# Patient Record
Sex: Male | Born: 1975 | Race: Asian | Hispanic: No | Marital: Married | Smoking: Never smoker
Health system: Southern US, Community
[De-identification: ages and names within clinical notes are randomized; demographics above are authoritative.]

## PROBLEM LIST (undated history)

## (undated) DIAGNOSIS — J45909 Unspecified asthma, uncomplicated: Secondary | ICD-10-CM

## (undated) HISTORY — DX: Unspecified asthma, uncomplicated: J45.909

---

## 2001-10-19 ENCOUNTER — Encounter: Payer: Self-pay | Admitting: Emergency Medicine

## 2001-10-19 ENCOUNTER — Emergency Department (HOSPITAL_COMMUNITY): Admission: EM | Admit: 2001-10-19 | Discharge: 2001-10-19 | Payer: Self-pay | Admitting: Emergency Medicine

## 2011-07-04 ENCOUNTER — Ambulatory Visit: Payer: Self-pay | Admitting: Physician Assistant

## 2011-07-04 ENCOUNTER — Ambulatory Visit: Payer: Self-pay

## 2011-07-04 VITALS — BP 157/89 | HR 121 | Temp 99.3°F | Resp 32 | Ht 65.0 in | Wt 165.2 lb

## 2011-07-04 DIAGNOSIS — R0902 Hypoxemia: Secondary | ICD-10-CM

## 2011-07-04 DIAGNOSIS — R05 Cough: Secondary | ICD-10-CM

## 2011-07-04 DIAGNOSIS — R509 Fever, unspecified: Secondary | ICD-10-CM

## 2011-07-04 DIAGNOSIS — R059 Cough, unspecified: Secondary | ICD-10-CM

## 2011-07-04 DIAGNOSIS — D7289 Other specified disorders of white blood cells: Secondary | ICD-10-CM

## 2011-07-04 DIAGNOSIS — D72829 Elevated white blood cell count, unspecified: Secondary | ICD-10-CM

## 2011-07-04 DIAGNOSIS — R0602 Shortness of breath: Secondary | ICD-10-CM

## 2011-07-04 LAB — POCT CBC
Granulocyte percent: 79.9 %G (ref 37–80)
HCT, POC: 44.1 % (ref 43.5–53.7)
Hemoglobin: 14.6 g/dL (ref 14.1–18.1)
Lymph, poc: 2.4 (ref 0.6–3.4)
MCH, POC: 26 pg — AB (ref 27–31.2)
MCHC: 33.1 g/dL (ref 31.8–35.4)
MCV: 78.6 fL — AB (ref 80–97)
MID (cbc): 0.8 (ref 0–0.9)
MPV: 14.7 fL (ref 0–99.8)
POC LYMPH PERCENT: 14.9 %L (ref 10–50)
RBC: 5.61 M/uL (ref 4.69–6.13)
RDW, POC: 14.3 %
WBC: 15.8 10*3/uL — AB (ref 4.6–10.2)

## 2011-07-04 LAB — POCT SEDIMENTATION RATE: POCT SED RATE: 18 mm/hr (ref 0–22)

## 2011-07-04 MED ORDER — ACETAMINOPHEN 325 MG PO TABS: 1000.0000 mg | ORAL_TABLET | Freq: Once | ORAL | Status: AC

## 2011-07-04 MED ORDER — ALBUTEROL SULFATE (2.5 MG/3ML) 0.083% IN NEBU
2.5000 mg | INHALATION_SOLUTION | Freq: Once | RESPIRATORY_TRACT | Status: AC
  Administered 2011-07-04: 2.5 mg via RESPIRATORY_TRACT

## 2011-07-04 MED ORDER — LEVOFLOXACIN 500 MG PO TABS: 500.0000 mg | ORAL_TABLET | Freq: Every day | ORAL | Status: AC

## 2011-07-04 MED ORDER — CEFTRIAXONE SODIUM 1 G IJ SOLR
1.0000 g | Freq: Once | INTRAMUSCULAR | Status: AC
  Administered 2011-07-04: 1 g via INTRAMUSCULAR

## 2011-07-04 NOTE — Progress Notes (Signed)
Subjective:    Patient ID: Matthew Brooks, male    DOB: 02-20-1976, 36 y.o.   MRN: [id number]  HPI Mr. Favila comes in today and brought back urgently c/o SOB since last night around 10 pm. There is a slight language barrier. He states that he has had a cough for a few days but denies fever or chills, myalgias, or congestion.  He has a history of a prolonged cough for the last 1-2 years with unknown etiology.  PPD 2011 negative.  He is not c/o chest pain at any time but states he feels bloated in his stomach.  No n/v.  No abdominal pain.  He does work around [employer] but states that he was not around any new chemicals yesterday.  He does not wear respirator or mask. Non smoker Healthy otherwise    Review of Systems  Constitutional: Negative for fever and chills.  HENT: Negative for congestion and sore throat.   Respiratory: Positive for cough, chest tightness, shortness of breath and wheezing.   Cardiovascular: Negative for chest pain and palpitations.  Gastrointestinal: Positive for abdominal distention. Negative for nausea, vomiting and abdominal pain.  Musculoskeletal: Negative for myalgias.  Neurological: Negative for dizziness and headaches.       Objective:   Physical Exam  Vitals reviewed. Constitutional: He is oriented to person, place, and time. He appears well-developed and well-nourished. He appears distressed (dyspnea).  HENT:  Mouth/Throat: Oropharynx is clear and moist.  Eyes: No scleral icterus.  Cardiovascular: Normal rate, regular rhythm and normal heart sounds.   Pulmonary/Chest: Accessory muscle usage present. Tachypnea noted. He has wheezes (throughout every field on initial exam).       Post nebulizer treatment, accessory muscle usage decreased.  Faint wheezes heard but no rales or rhonchi.  Abdominal: Soft. Normal appearance and bowel sounds are normal. There is no tenderness.  Lymphadenopathy:    He has no cervical adenopathy.  Neurological: He is alert and  oriented to person, place, and time.  Skin: Skin is warm. He is not diaphoretic.    Results for orders placed in visit on 07/04/11  POCT CBC      Component Value Range   WBC 15.8 (*) 4.6 - 10.2 (K/uL)   Lymph, poc 2.4  0.6 - 3.4    POC LYMPH PERCENT 14.9  10 - 50 (%L)   MID (cbc) 0.8  0 - 0.9    POC MID % 5.2  0 - 12 (%M)   POC Granulocyte 12.6 (*) 2 - 6.9    Granulocyte percent 79.9  37 - 80 (%G)   RBC 5.61  4.69 - 6.13 (M/uL)   Hemoglobin 14.6  14.1 - 18.1 (g/dL)   HCT, POC 14.7  82.9 - 53.7 (%)   MCV 78.6 (*) 80 - 97 (fL)   MCH, POC 26.0 (*) 27 - 31.2 (pg)   MCHC 33.1  31.8 - 35.4 (g/dL)   RDW, POC 56.2     Platelet Count, POC 197  142 - 424 (K/uL)   MPV 14.7  0 - 99.8 (fL)   Pulse OX at Presentation 92% 2 Albuterol nebulizer treatments given and put on 2 liters Oxygen.  Pulse ox 98%. Distant wheezing noted on exam. No rales or rhonchi. Taken off oxygen for 5 minutes.  Pulse ox 92% Rest with no oxygen 94% 15 minutes later, Ambulated with no oxygen, pulse ox 97% Rested 10 minutes, pulse ox at rest 92-96% Ambulated 10 minutes later, pulse ox    [facility]  reading (PRIMARY) by  Dr. Neva Seat.  No infilitrate. Increased markings right.  Granuloma LLL (previously noted 2011)  EKG    Assessment & Plan:  SOB Wheezing Fever Cough Leukocytosis Hypoxia  At discharge, patient states he feels much better and pulse ox is 94-96%. Arranged for pt to obtain nebulizer machine from [facility] today.  Albuterol Nebule sx given. Levaquin 500 x 10 days. Home to rest.  Fever control and hydration. Advised to go to ER if increased SOB, chest pain, or new symptoms arise.   Recheck with me tomorrow or sooner if worse.  Patient and wife both express understanding of instructions.  Discussed with Dr. Neva Seat

## 2011-07-04 NOTE — Patient Instructions (Signed)
Use the breathing treatment machine every 4 hours. Albuterol Nebules have been provided for you to use in your breathing machine. Take the antibiotic once a day with food. Take tylenol or advil for fever  If you have worsening shortness of breath or experience chest pain, go to [facility] ER or [facility]. Return tomorrow for recheck. I come in at 5.  If you are experiencing any trouble in the morning, come in earlier.   Go to [facility] for Breathing machine [facility]  [address]

## 2011-11-08 ENCOUNTER — Ambulatory Visit: Payer: Self-pay | Admitting: Physician Assistant

## 2011-11-08 VITALS — BP 124/84 | HR 87 | Temp 98.0°F | Resp 20 | Ht 65.0 in | Wt 165.0 lb

## 2011-11-08 DIAGNOSIS — R05 Cough: Secondary | ICD-10-CM

## 2011-11-08 DIAGNOSIS — R059 Cough, unspecified: Secondary | ICD-10-CM

## 2011-11-08 DIAGNOSIS — J45909 Unspecified asthma, uncomplicated: Secondary | ICD-10-CM

## 2011-11-08 MED ORDER — ALBUTEROL SULFATE (2.5 MG/3ML) 0.083% IN NEBU: 2.5000 mg | INHALATION_SOLUTION | Freq: Four times a day (QID) | RESPIRATORY_TRACT | Status: DC | PRN

## 2011-11-08 MED ORDER — ALBUTEROL SULFATE (2.5 MG/3ML) 0.083% IN NEBU
2.5000 mg | INHALATION_SOLUTION | Freq: Once | RESPIRATORY_TRACT | Status: AC
  Administered 2011-11-08: 2.5 mg via RESPIRATORY_TRACT

## 2011-11-08 MED ORDER — ALBUTEROL SULFATE HFA 108 (90 BASE) MCG/ACT IN AERS: 2.0000 | INHALATION_SPRAY | Freq: Four times a day (QID) | RESPIRATORY_TRACT | Status: DC | PRN

## 2011-11-08 NOTE — Progress Notes (Signed)
Patient ID: Matthew Brooks MRN: [id number], DOB: Jun 14, 1975, 36 y.o. Date of Encounter: 11/08/2011, 10:42 AM  Primary Physician: No primary provider on file.  Chief Complaint: Asthma follow up  HPI: 36 y.o. year old male with history below presents for follow up of his asthma. Seen here urgently back in May. Was advised to follow up in 24 hours, he did not because he was feeling so much better with his treatment. Today he returns to our clinic for refills of his asthma medication and for evaluation of a mild nonproductive cough. Has had this mild cough for 2 days. Cough is nonproductive. Not associated with time of day. Afebrile. No chills. No shortness of breath or wheezing. No dyspnea. No congestion, or myalgias. No chest tightness. Has not had a single flare up of his asthma since his evaluation here back in May. He is quite pleased with the control of his asthma currently. 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 He does not wear a mask or respirator.    Past Medical History  Diagnosis Date  . Asthma      Home Meds: Prior to Admission medications   Medication Sig Start Date End Date Taking? Authorizing Provider  albuterol (PROVENTIL HFA) 108 (90 BASE) MCG/ACT inhaler Inhale 2 puffs into the lungs every 6 (six) hours as needed.   Yes Historical Provider, MD    Allergies: No Known Allergies  History   Social History  . Marital Status: Married    Spouse Name: N/A    Number of Children: N/A  . Years of Education: N/A   Occupational History  . Not on file.   Social History Main Topics  . Smoking status: Never Smoker   . Smokeless tobacco: Not on file  . Alcohol Use: Not on file  . Drug Use: Not on file  . Sexually Active: Not on file   Other Topics Concern  . Not on file   Social History Narrative  . No narrative on file     Review of Systems: Constitutional: negative for chills, fever, night sweats, weight changes, or fatigue  HEENT: negative for vision  changes, hearing loss, congestion, rhinorrhea, ST, epistaxis, or sinus pressure Cardiovascular: negative for chest pain or palpitations Respiratory: negative for hemoptysis, wheezing, or shortness of breath Abdominal: negative for abdominal pain, nausea, vomiting, diarrhea, or constipation Dermatological: negative for rash Neurologic: negative for headache, dizziness, or syncope All other systems reviewed and are otherwise negative with the exception to those above and in the HPI.   Physical Exam: Blood pressure 124/84, pulse 87, temperature 98 F (36.7 C), temperature source Oral, resp. rate 20, height 5\' 5"  (1.651 m), weight 165 lb (74.844 kg), SpO2 100.00%., Body mass index is 27.46 kg/(m^2). General: Well developed, well nourished, in no acute distress. Head: Normocephalic, atraumatic, eyes without discharge, sclera non-icteric, nares are without discharge. Bilateral auditory canals clear, TM's are without perforation, pearly grey and translucent with reflective cone of light bilaterally. Oral cavity moist, posterior pharynx without exudate, erythema, peritonsillar abscess, or post nasal drip. Poor dentition.  Neck: Supple. No thyromegaly. Full ROM. No lymphadenopathy. Lungs: Mild expiratory wheezing bilaterally without  rales, or rhonchi. Breathing is unlabored. Heart: RRR with S1 S2. No murmurs, rubs, or gallops appreciated. Msk:  Strength and tone normal for age. Extremities/Skin: Warm and dry. No clubbing or cyanosis. No edema. No rashes or suspicious lesions. Neuro: Alert and oriented X 3. Moves all extremities spontaneously. Gait is normal. CNII-XII grossly in tact. Psych:  Responds to questions appropriately with a normal affect.   S/P albuterol neb: Patient feels that he can breath much better. No further cough. No further wheezing to auscultation.    ASSESSMENT AND PLAN:  36 y.o. 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

## 2012-11-05 ENCOUNTER — Ambulatory Visit: Payer: 59 | Admitting: Emergency Medicine

## 2012-11-05 VITALS — BP 112/72 | HR 76 | Temp 97.2°F | Resp 18 | Ht 65.5 in | Wt 165.2 lb

## 2012-11-05 DIAGNOSIS — R059 Cough, unspecified: Secondary | ICD-10-CM

## 2012-11-05 DIAGNOSIS — J45909 Unspecified asthma, uncomplicated: Secondary | ICD-10-CM

## 2012-11-05 DIAGNOSIS — R05 Cough: Secondary | ICD-10-CM

## 2012-11-05 MED ORDER — ALBUTEROL SULFATE HFA 108 (90 BASE) MCG/ACT IN AERS: 2.0000 | INHALATION_SPRAY | Freq: Four times a day (QID) | RESPIRATORY_TRACT | Status: DC | PRN

## 2012-11-05 NOTE — Patient Instructions (Addendum)
Hen Suy?n, Ng??i L?n  (Asthma, Adult)  Hen suy?n l tnh tr?ng ?nh h??ng t?i ph?i. N ???c ??c tr?ng b?i s?ng v h?p ???ng h h?p c?ng nh? t?ng ti?t d?ch nh?y. H?p l do s?ng v co th?t c? bn trong ???ng h h?p. Khi ?i?u ny x?y ra, th? c th? kh kh?n v b?n c th? c ho, th? kh kh v th? d?c. Cng hi?u nhi?u v? hen suy?n th cng c th? gip b?n ch? ng? n t?t h?n. Khng th? ch?a kh?i hen suy?n, nh?ng thu?c v thay ??i cch s?ng c th? gip ki?m sot n. Hen suy?n c th? l v?n ?? nh? ??i v?i m?t s? ng??i nh?ng n?u hen suy?n khng ???c ki?m sot , n c th? d?n t?i c?n hen ?e d?a ??n tnh m?ng. Hen suy?n c th? thay ??i theo th?i gian. ?i?u quan tr?ng l c?n ph?i h?p v?i chuyn gia ch?m [place] y t? c?a b?n ?? ch? ng? tri?u ch?ng hen suy?n. NGUYN NHN  Nguyn nhn chnh xc gy hen suy?n khng ???c xc ??nh. Hen suy?n ???c cho l do di truy?n (di truy?n) v ti?p xc v?i mi tr??ng. S?ng v t?y ?? (vim) ???ng h h?p x?y ra trong hen suy?n. ?i?u ny c th? b? kch thch b?i d? ?ng, nhi?m trng ph?i do vi rt, ho?c cc ch?t kch thch trong khng kh. Ph?n ?ng d? ?ng c th? khi?n b?n th? kh kh ngay l?p t?c ho?c vi gi? sau khi ti?p xc. Tc nhn gy hen suy?n khc nhau ??i v?i t?ng ng??i. ?i?u quan tr?ng l ph?i ch  v bi?t nguyn nhn gy ra hen suy?n.  Tc nhn ph? bi?n gy ra c?n hen bao g?m:   B?i b?n t? da, tc ??ng v?t ho?c lng ??ng v?t.  M?t b?i c trong b?i trong nh.  Gin.  Ph?n hoa t? cy ho?c c?.  N?m m?c.  Thu?c l ho?c khi thu?c l. Khng th? cho php ht thu?c trong nh c ng??i b? hen suy?n. Ng??i b? hen suy?n khng nn ht thu?c v khng nn ? g?n ng??i ht thu?c.  Cc ch?t  nhi?m khng kh nh? b?i, ch?t t?y r?a gia d?ng, ch?t x?t tc, ch?t phun x?t, h?i s?n, ha ch?t m?nh ho?c mi m?nh.  Khng kh l?nh ho?c thay ??i th?i ti?t. Khng kh l?nh c th? gy vim. Gi lm t?ng n?m m?c, ph?n hoa trong khng kh. Khng c m?t kh h?u t?t nh?t cho ng??i b? hen suy?n.  Nh?ng c?m  xc m?nh nh? khc ho?c c??i nhi?u.  S? c?ng th?ng.  M?t s? lo?i thu?c c? th? nh? atpirin ho?c thu?c ch?n beta.  [employer] trong cc lo?i th?c ph?m v ?? u?ng nh? tri cy kh v r??u vang.  Cc tnh tr?ng nhi?m trng ho?c vim nh? cm, c?m l?nh ho?c vim mng m?i (vim m?i ).  B?nh tro ng??c d? dy th?c qu?n (GERD). GERD l tnh tr?ng axit trong d? dy tro ng??c ln c? h?ng (th?c qu?n).  T?p luy?n ho?c ho?t ??ng v?t v?. Thu?c tr??c khi t?p th? d?c thch h?p cho php h?u h?t m?i ng??i tham gia cc mn th? thao. TRI?U CH?NG   C?m th?y th? d?c.  T?c ho?c ?au ng?c.  Kh ng? do ho, th? kh kh ho?c c?m th?y th? d?c.  m thanh hut so hay th? kh kh v?i vi?c th? ra.  Ho ho?c th? kh kh t? h?n khi b?n:  B? nhi?m vi  rt (nh? c?m l?nh ho?c cm).  ?ang b? d? ?ng.  Ti?p xc v?i m?t s? lo?i h?i ho?c ha ch?t nh?t ??nh.  T?p luy?n. D?u hi?u cho th?y hen suy?n c th? tr? nn t? h?n bao g?m:   D?u hi?u v tri?u ch?ng c?a hen suy?n th??ng xuyn h?n v kh ch?u.  T?ng hi?n t??ng kh th?. Hi?n t??ng ny c th? ???c ?o b?ng my ?o l?u l??ng ??nh, ?y l m?t thi?t b? ??n gi?n ???c s? d?ng ?? ki?m tra xem ph?i c?a b?n ?ang ho?t ??ng nh? th? no.  Ngy cng c?n s? d?ng ?ng ht gi?m ?au nhanh th??ng xuyn h?n. CH?N ?ON  Vi?c ch?n ?on hen suy?n ???c th?c hi?n b?ng cch xem xt b?nh s? c?a b?n, khm tr?c ti?p v c th? t? cc xt nghi?m khc. Nghin c?u ch?c n?ng ph?i c th? gip ch?n ?on.  ?I?U TR?  Khng th? ch?a kh?i hen suy?n. Tuy nhin, ??i v?i ?a ph?n ng??i l?n, hen suy?n c th? ???c ki?m sot b?ng ?i?u tr?. Bn c?nh vi?c trnh cc tc nhn gy hen suy?n, th??ng c?n dng c? thu?c. C 2 lo?i thu?c ???c s? d?ng ?? ?i?u tr? hen suy?n: thu?c ki?m sot (gi?m vim v tri?u ch?ng) v thu?c lm gi?m ho?c thu?c gi?i c?u (gi?m tri?u ch?ng hen suy?n trong c?n hen c?p tnh). B?n c th? c?n dng thu?c hng ngy ?? ki?m sot hen suy?n. Cc lo?i thu?c ki?m sot lu di hi?u qu? nh?t cho hen suy?n l  corticosteroid ?? ht (ng?n ch?n vim). Cc lo?i thu?c ki?m sot lu di khc bao g?m:   Thu?c khng leukotriene (ch?n ???ng c?a tnh tr?ng vim).  Cc lo?i thu?c tc d?ng di h?n (th? gin c? b?p c?a ???ng h h?p t nh?t 12 gi?) v?i corticosteroid dng ?? ht.  Cromolyn natri ho?c nedocromil (lm thay ??i kh? n?ng gi?i phng cc ha ch?t gy vim c?a m?t s? t? bo b? vim).  Immunomodulator (lm thay ??i h? th?ng mi?n d?ch ?? ng?n ch?n cc tri?u ch?ng hen suy?n).  Theophylline (th? gin c? trong ???ng h h?p). B?n c?ng c th? yu c?u m?t lo?i thu?c tc d?ng ng?n h?n ?? lm gi?m cc tri?u ch?ng hen suy?n trong c?n hen c?p tnh.  B?n nn hi?u ph?i lm g trong c?n hen c?p tnh. Thu?c ht c hi?u qu? khi ???c s? d?ng ?ng cch. ??c h??ng d?n v? cch s? d?ng thu?c c?a b?n m?t cch chnh xc v ni chuy?n v?i chuyn gia ch?m [place] y t? n?u b?n c cu h?i. ??n g?p chuyn gia ch?m [place] y t? ?? khm l?i theo ??nh k? ?? ??m b?o b?nh hen suy?n c?a b?n ???c ki?m sot t?t. N?u hen suy?n khng ???c ki?m sot t?t, n?u b?n b? nh?p vi?n v hen suy?n, ho?c n?u c?n nhi?u lo?i thu?c ho?c corticosteroid dng ?? ht li?u v?a ??n li?u cao ?? ki?m sot b?nh hen suy?n c?a b?n, hy yu c?u gi?i thi?u ??n m?t chuyn gia v? b?nh hen suy?n.  H??NG D?N CH?M [place] T?I NH   S? d?ng thu?c theo ch? d?n c?a chuyn gia ch?m [place] y t?.  Ki?m sot mi tr??ng trong nh c?a b?n theo nh?ng cch sau ?? gip ng?n ch?n c?n hen:  Thay b? l?c l s??i v ?i?u ha khng kh t nh?t m?t l?n m?i thng.  ??t b? l?c ho?c mi?ng v?i th?a trn cc l? thng h?i c?a l s??i v ?i?u ha khng kh.  H?n ch? s? d?ng l s??i v b?p c?i.  Khng ht thu?c l. Khng ? nh?ng n?i c ng??i khc ?ang ht thu?c.  Di?t v?t gy h?i (ch?ng h?n nh? gin v chu?t) v phn c?a chng.  N?u b?n nhn th?y n?m m?c trn cy, hy v?t n ?i.  Lau sn nh v ht b?i m?i tu?n. S? d?ng s?n ph?m lm s?ch khng mi. S? d?ng my ht b?i s?ch h?n v?i b? l?c HEPA, n?u c th?.  N?u vi?c ht b?i ho?c d?n d?p gy ra hen suy?n cho b?n, hy c? g?ng tm ng??i no khc ?? lm cc vi?c v?t ny.  Sn nh nn lm b?ng g?, ?t lt ho?c nh?a vinyl. Th?m c th? dnh lng v b?i.  S? d?ng g?i, ga tr?i gi??ng v ga tr?i n?m l xo ch?ng d? ?ng.  Gi?t kh?n tr?i gi??ng v ch?n hng tu?n trong n??c nng v lm kh trong my s?y.  S? d?ng ch?n ???c lm b?ng s?i t?ng h?p ho?c bng v?i d?t tuy?t ch?t.  Khng s? d?ng di?m ng?n cho gi??ng.  V? sinh phng t?m v nh b?p b?ng thu?c t?y v s?n l?i b?ng s?n ch?ng n?m m?c.  R?a tay th??ng xuyn.  Ni chuy?n v?i chuyn gia ch?m [place] y t? c?a b?n v? m?t k? ho?ch hnh ??ng ?? qu?n l c?n hen. ?i?u ny bao g?m vi?c s? d?ng m?t my ?o l?u l??ng ??nh ?? ?o m?c ?? nghim tr?ng c?a c?n hen v cc lo?i thu?c c th? gip ng?n ch?n c?n hen. M?t k? ho?ch hnh ??ng c th? gip gi?m thi?u ho?c ng?n ch?n cc c?n hen m khng c?n ph?i nh? ??n s? ch?m [place] y t?.  Gi? bnh t?nh trong c?n hen.  Lun c m?t k? ho?ch chu?n b? tham v?n v?i chuyn gia y t?. ?i?u ny bao g?m c? vi?c lin h? v?i chuyn gia ch?m [place] y t? c?a b?n v trong tr??ng h?p b? c?n hen n?ng, hy g?i d?ch v? c?p c?u t?i ??a ph??ng (911 t?i [place] HY THAM V?N V?I CHUYN GIA Y T? N?U:   B?n th? kh kh, th? d?c ho?c ho ngay c? khi ? dng thu?c ?? ng?n ch?n c?n hen.  B?n c ?m dy.  ?m c?a b?n thay ??i t? khng mu (trong) ho?c mu tr?ng sang mu vng, xanh, xm ho?c c mu.  B?n c b?t k? v?n ?? no lin quan ??n thu?c ?ang s? d?ng (ch?ng h?n nh? pht ban, ng?a, s?ng ho?c kh th?).  B?n s? d?ng thu?c lm gi?m hen suy?n nhi?u h?n 2-3 l?n m?i tu?n.  L?u l??ng ??nh c?a b?n v?n ? kho?ng 50-79% ch? s? c nhn t?t nh?t sau khi lm theo k? ho?ch hnh ??ng c?a b?n trong 1 gi?. HY NGAY L?P T?C THAM V?N V?I CHUYN GIA Y T? N?U:   B?n th? d?c ngay c? khi ngh?Marland Kitchen  B?n th? d?c khi th?c hi?n ho?t ??ng th? ch?t r?t t.  B?n g?p kh kh?n khi ?n, u?ng ho?c ni chuy?n do cc tri?u ch?ng hen  suy?n.  B?n b? ?au ng?c ho?c c?m th?y tim ??p nhanh.  Mi ho?c mng tay c?a b?n c mu xanh nh?t.  B?n b? ?au ??u, chng m?t ho?c ng?t x?u.  B?n b? s?t ho?c c cc tri?u ch?ng ko di h?n 2-3 ngy.  B?n b? s?t v cc tri?u ch?ng ??t nhin x?u ?i.  B?n d??ng nh? tr? nn t? h?n v khng ?p ?  ng v?i vi?c ?i?u tr? trong c?n hen.  L?u l??ng ??nh c?a b?n d??i 50% ch? s? c nhn t?t nh?t. ??M B?O B?N:   Hi?u cc h??ng d?n ny.  S? theo di tnh tr?ng c?a mnh.  S? yu c?u tr? gip ngay l?p t?c n?u b?n c?m th?y khng kh?e ho?c tnh tr?ng tr? nn t?i h?n. Document Released: 02/19/2005 Document Revised: 02/06/2012 [facility] Patient Information 2014 [place], [place].

## 2012-11-05 NOTE — Progress Notes (Signed)
[facility] [address] [place] [address] [phone]  Date:  11/05/2012   Name:  Matthew Brooks   DOB:  1976-02-21   MRN:  [id number]  PCP:  No primary provider on file.    Chief Complaint: Medication Refill and Cough   History of Present Illness:  Matthew Brooks is a 37 y.o. very pleasant male patient who presents with the following:  History of asthma.  Ran out of his MDI 2 weeks ago and has been doing reasonably well.  Says he usually uses his MDI 2-3 times a week.  At one time he was not under control and used his MDI 2-3 times a day.  No improvement with over the counter medications or other home remedies. Denies other complaint or health concern today.    There are no active problems to display for this patient.   Past Medical History  Diagnosis Date  . Asthma     History reviewed. No pertinent past surgical history.  History  Substance Use Topics  . Smoking status: Never Smoker   . Smokeless tobacco: Not on file  . Alcohol Use: No    History reviewed. No pertinent family history.  No Known Allergies  Medication list has been reviewed and updated.  Current Outpatient Prescriptions on File Prior to Visit  Medication Sig Dispense Refill  . albuterol (PROVENTIL HFA) 108 (90 BASE) MCG/ACT inhaler Inhale 2 puffs into the lungs every 6 (six) hours as needed.  1 Inhaler  2  . albuterol (PROVENTIL) (2.5 MG/3ML) 0.083% nebulizer solution Take 3 mLs (2.5 mg total) by nebulization every 6 (six) hours as needed for wheezing.  75 mL  11   Current Facility-Administered Medications on File Prior to Visit  Medication Dose Route Frequency Provider Last Rate Last Dose  . acetaminophen (TYLENOL) tablet 975 mg  975 mg Oral Once Pattricia Boss, PA-C        Review of Systems:  As per HPI, otherwise negative.    Physical Examination: Filed Vitals:   11/05/12 1408  BP: 112/72  Pulse: 76  Temp: 97.2 F (36.2 C)  Resp: 18   Filed Vitals:   11/05/12  1408  Height: 5' 5.5" (1.664 m)  Weight: 165 lb 3.2 oz (74.934 kg)   Body mass index is 27.06 kg/(m^2). Ideal Body Weight: Weight in (lb) to have BMI = 25: 152.2  GEN: WDWN, NAD, Non-toxic, A & O x 3 HEENT: Atraumatic, Normocephalic. Neck supple. No masses, No LAD. Ears and Nose: No external deformity. CV: RRR, No M/G/R. No JVD. No thrill. No extra heart sounds. PULM: CTA B, no wheezes, crackles, rhonchi. No retractions. No resp. distress. No accessory muscle use. ABD: S, NT, ND, +BS. No rebound. No HSM. EXTR: No c/c/e NEURO Normal gait.  PSYCH: Normally interactive. Conversant. Not depressed or anxious appearing.  Calm demeanor.    Assessment and Plan: Asthma  Signed,  Phillips Odor, MD

## 2013-07-31 ENCOUNTER — Ambulatory Visit: Payer: 59

## 2013-07-31 ENCOUNTER — Telehealth: Payer: Self-pay | Admitting: *Deleted

## 2013-07-31 ENCOUNTER — Ambulatory Visit: Payer: 59 | Admitting: Family Medicine

## 2013-07-31 VITALS — BP 122/76 | HR 103 | Temp 97.9°F | Resp 16 | Ht 65.0 in | Wt 166.0 lb

## 2013-07-31 DIAGNOSIS — R112 Nausea with vomiting, unspecified: Secondary | ICD-10-CM

## 2013-07-31 DIAGNOSIS — J189 Pneumonia, unspecified organism: Secondary | ICD-10-CM

## 2013-07-31 DIAGNOSIS — R509 Fever, unspecified: Secondary | ICD-10-CM

## 2013-07-31 LAB — POCT CBC
Granulocyte percent: 80.3 %G — AB (ref 37–80)
HCT, POC: 43.6 % (ref 43.5–53.7)
Hemoglobin: 14.1 g/dL (ref 14.1–18.1)
Lymph, poc: 1.3 (ref 0.6–3.4)
MCH, POC: 26.3 pg — AB (ref 27–31.2)
MCHC: 32.3 g/dL (ref 31.8–35.4)
MCV: 81.2 fL (ref 80–97)
MID (cbc): 0.5 (ref 0–0.9)
MPV: 14.6 fL (ref 0–99.8)
POC Granulocyte: 7.6 — AB (ref 2–6.9)
POC LYMPH PERCENT: 14.2 %L (ref 10–50)
POC MID %: 5.5 %M (ref 0–12)
Platelet Count, POC: 174 10*3/uL (ref 142–424)
RBC: 5.37 M/uL (ref 4.69–6.13)
RDW, POC: 13.8 %
WBC: 9.5 10*3/uL (ref 4.6–10.2)

## 2013-07-31 MED ORDER — AMOXICILLIN-POT CLAVULANATE 875-125 MG PO TABS: 1.0000 | ORAL_TABLET | Freq: Two times a day (BID) | ORAL | Status: DC

## 2013-07-31 MED ORDER — HYDROCODONE-HOMATROPINE 5-1.5 MG/5ML PO SYRP: 5.0000 mL | ORAL_SOLUTION | Freq: Three times a day (TID) | ORAL | Status: DC | PRN

## 2013-07-31 NOTE — Telephone Encounter (Signed)
[facility] called report: Left lower lobe airspace opacity concerning for pneumonia. Pt is taking Augmentin and Hycodan. Please advise any further instructions for pt.

## 2013-07-31 NOTE — Progress Notes (Signed)
This chart was scribed for Caroleen Hamman, MD by Eston Mould, ED Scribe. This patient was seen in room Room/bed 10 and the patient's care was started at 4:40 PM. Subjective:    Patient ID: Matthew Brooks, male    DOB: Jun 25, 1975, 38 y.o.   MRN: [id number] Chief Complaint  Patient presents with   Fever   Emesis   HPI Matthew Brooks is a 38 y.o. male who presents to the [facility] complaining of subjective fever x 2 days and emesis with associated diarrhea that began today. He reports having a decreased appetite and states "food does not taste good". Reports having a slight cough; states he uses ProAir. Denies having abd pain  Work: Works in [employer].  There are no active problems to display for this patient.  Current outpatient prescriptions:albuterol (PROVENTIL HFA) 108 (90 BASE) MCG/ACT inhaler, Inhale 2 puffs into the lungs every 6 (six) hours as needed., Disp: 1 Inhaler, Rfl: 12;  albuterol (PROVENTIL) (2.5 MG/3ML) 0.083% nebulizer solution, Take 3 mLs (2.5 mg total) by nebulization every 6 (six) hours as needed for wheezing., Disp: 75 mL, Rfl: 11 Current facility-administered medications:acetaminophen (TYLENOL) tablet 975 mg, 975 mg, Oral, Once, Beatriz Chancellor, PA-C  Review of Systems  Constitutional: Positive for fever. Negative for appetite change.  Respiratory: Positive for cough.   Gastrointestinal: Positive for vomiting and diarrhea.   Objective:   Physical Exam  Nursing note and vitals reviewed. Constitutional: He is oriented to person, place, and time. He appears well-developed and well-nourished. No distress.  Pt speaks with a strong [place] accent and is hard to understand.  HENT:  Head: Normocephalic and atraumatic.  Right Ear: External ear normal.  Left Ear: External ear normal.  Eyes: Conjunctivae are normal. Pupils are equal, round, and reactive to light.  Neck: Normal range of motion. Neck supple.  Cardiovascular: Normal rate, regular rhythm and  normal heart sounds.   No murmur heard. Pulmonary/Chest: Effort normal. He has wheezes (primarily to L). He has rales (to L chest). He exhibits no tenderness.  Abdominal: Soft. Bowel sounds are normal. He exhibits no distension and no mass. There is no tenderness. There is no rebound and no guarding.  Musculoskeletal: Normal range of motion.  Neurological: He is alert and oriented to person, place, and time. He has normal reflexes.  Skin: Skin is warm and dry. He is not diaphoretic.  Psychiatric: He has a normal mood and affect. His behavior is normal.   Triage Vitals:BP 122/76   Pulse 103   Temp(Src) 97.9 F (36.6 C) (Oral)   Resp 16   Ht 5\' 5"  (1.651 m)   Wt 166 lb (75.297 kg)   BMI 27.62 kg/m2   SpO2 96%  [facility] preliminary x-ray report read by Dr. Floreen Comber: L lower lobe hazy density.   Results for orders placed in visit on 07/31/13  POCT CBC      Result Value Ref Range   WBC 9.5  4.6 - 10.2 K/uL   Lymph, poc 1.3  0.6 - 3.4   POC LYMPH PERCENT 14.2  10 - 50 %L   MID (cbc) 0.5  0 - 0.9   POC MID % 5.5  0 - 12 %M   POC Granulocyte 7.6 (*) 2 - 6.9   Granulocyte percent 80.3 (*) 37 - 80 %G   RBC 5.37  4.69 - 6.13 M/uL   Hemoglobin 14.1  14.1 - 18.1 g/dL   HCT, POC 43.6  43.5 - 53.7 %   MCV  81.2  80 - 97 fL   MCH, POC 26.3 (*) 27 - 31.2 pg   MCHC 32.3  31.8 - 35.4 g/dL   RDW, POC 13.8     Platelet Count, POC 174  142 - 424 K/uL   MPV 14.6  0 - 99.8 fL   Assessment & Plan:  Will complete CXR and CBC.  Fever - Plan: POCT CBC, DG Chest 2 View, amoxicillin-clavulanate (AUGMENTIN) 875-125 MG per tablet  Pneumonia - Plan: amoxicillin-clavulanate (AUGMENTIN) 875-125 MG per tablet, HYDROcodone-homatropine (HYCODAN) 5-1.5 MG/5ML syrup  Nausea with vomiting  Signed, Robyn Haber, MD

## 2013-07-31 NOTE — Patient Instructions (Signed)
B?nh Vim Ph?i ? Ng??i L?n (Pneumonia, Adult) Vim ph?i l b?nh nhi?m trng ph?i.  NGUYN NHN N c th? do vi khu?n ho?c vi rt. Thng th??ng, cc nhi?m trng ny l do ht ph?i cc h?t b? nhi?m trng vo ph?i (???ng h h?p). TRI?U CH?NG  Ho.  S?t.  ?au ng?c.  Th? nhanh.  Kh kh.  Kh?c ??m. CH?N ?ON N?u b?n c cc tri?u ch?ng ph? bi?n c?a b?nh vim ph?i, chuyn gia ch?m sc s?c kh?e c?a b?n th??ng s? xc ??nh ch?n ?on b?ng ch?p X-quang ng?c. X-quang s? hi?n th? ph?n b?t th??ng trong ph?i (thm nhi?m ph?i) n?u b?n b? vim ph?i. Cc xt nghi?m mu, n??c ti?u ho?c ??m c th? ???c th?c hi?n ?? tm ra nguyn nhn c? th? gy b?nh vim ph?i c?a b?n. Chuyn gia ch?m sc s?c kh?e c?a b?n c?ng c th? lm cc xt nghi?m (?o kh trong mu ho?c ?o ?? bo ha -xy trong mu) ?? xem ph?i c?a b?n ?ang ho?t ??ng nh? th? no. ?I?U TR? M?t s? d?ng vim ph?i c th? ly lan sang ng??i khc khi b?n ho ho?c h?t h?i. B?n c th? ???c yu c?u ?eo kh?u trang tr??c v trong qu trnh khm. Vim ph?i do vi khu?n ???c ?i?u tr? b?ng thu?c khng sinh. Vim ph?i do vi rt cm c th? ???c ?i?u tr? b?ng thu?c khng vi rt. H?u h?t cc b?nh nhi?m vi rt khc ph?i di?n ra h?t qu trnh c?a b?nh. Cc b?nh nhi?m trng ny s? khng ?p ?ng v?i thu?c khng sinh. PHNG NG?A Tim phng (v?c-xin) ph? c?u khu?n c th? s? d?ng ???c ?? phng ng?a vim ph?i do ph? c?u khu?n. ?i?u ny th??ng ???c ?? ngh? cho:  Nh?ng ng??i trn 66 tu?i.  B?nh nhn ha tr? li?u.  Nh?ng ng??i c v?n ?? v? ph?i m?n tnh, ch?ng h?n nh? vim ti?u ph? qu?n ho?c kh ph? th?ng.  Nh?ng ng??i c v?n ?? h? th?ng mi?n d?ch. N?u b?n trn 66 tu?i ho?c c m?t tnh tr?ng nguy c? cao, b?n c th? ???c tim v?cxin ph? c?u khu?n n?u b?n khng ???c tim n tr??c ?. ? m?t s? n??c, v?cxin cm th??ng xuyn c?ng ???c ?? ngh?. V?cxin ny c th? gip phng ng?a m?t s? tr??ng h?p vim ph?i. B?n c th? s? ???c tim v?cxin cm l m?t ph?n c?a vi?c ch?m sc. N?u b?n ht  thu?c, ? t?i lc b? thu?c. B?n c th? ???c h??ng d?n v? cch d?ng ht thu?c t?t nh?t. Chuyn gia ch?m sc s?c kh?e c?a b?n c th? cung c?p thu?c ?i?u tr? v t? v?n ?? gip b?n b? thu?c l. H??NG D?N CH?M SC T?I NH  Thu?c ho c th? ???c s? d?ng n?u b?n khng ???c ngh? ng?i nhi?u. Tuy nhin, ho b?o v? b?n b?ng cch lm s?ch ph?i. B?n nn trnh s? d?ng thu?c gi?m ho n?u c th?.  Chuyn gia ch?m sc s?c kh?e c?a b?n c th? ? k ??n thu?c n?u ngh? r?ng b?n b? vim ph?i do vi khu?n ho?c cm. Hy dng h?t thu?c ngay c? khi b?n b?t ??u c?m th?y kh h?n.  Chuyn gia ch?m sc s?c kh?e ? c?ng c th? k ??n thu?c long ??m Thu?c ny c tc d?ng lm long ??m ?? ho ra.  Ch? dng cc thu?c khng c?n k toa ho?c thu?c c?n k toa ?? gi?m ?au, gi?m c?m gic kh ch?u, ho?c h? s?t theo nh? h??ng d?n c?a   chuyn gia ch?m sc s?c kh?e.  Khng ht thu?c l. Ht thu?c l l nguyn nhn ph? bi?n gy vim ph? qu?n v c th? gp ph?n vo vim ph?i. N?u b?n ht thu?c v ti?p t?c ht thu?c, ch?ng ho c?a b?n c th? ko di vi tu?n sau khi h?t vim ph?i.  My phun h?i n??c mt ho?c my t?o h?i ?m trong phng hay nh b?n c th? gip lm long d?ch nh?y.  Ho th??ng n?ng h?n vo ban ?m. Ng? ? t? th? n?a n?m n?a ng?i trong m?t chi?c gh? t?a ho?c s? d?ng m?t ?i g?i d??i ??u s? gip lm d?u b?t v?n ?? ny.  Ngh? ng?i khi b?n c?m th?y c?n. C? th? c?a b?n th??ng s? cho b?n bi?t khi no c?n ngh? ng?i. HY NGAY L?P T?C ?I KHM N?U:  B?nh c?a b?n tr? nn t? h?n. ?i?u ny ??c bi?t ?ng n?u b?n ? c tu?i ho?c b? y?u do b?t k? b?nh no khc.  B?n khng th? ki?m sot b?nh ho b?ng thu?c gi?m ho v m?t ng?.  B?n b?t ??u ho ra mu.  B?n ngy cng ?au h?n ho?c u?ng thu?c khng c tc d?ng gi?m ?au.  B?n b? s?t.  B?t k? tri?u ch?ng no ban ??u ??a b?n ??n ?i?u tr? tr? nn nghim tr?ng h?n thay v t?t h?n.  B?n b? kh th? ho?c ?au ng?c. HY CH?C CH?N R?NG B?N:  Hi?u r nh?ng h??ng d?n ny.  S? theo di tnh tr?ng b?nh c?a  b?n.  S? yu c?u tr? gip ngay l?p t?c n?u b?n khng ?? ho?c tnh tr?ng tr?m tr?ng h?n. Document Released: 02/19/2005 Document Revised: 12/10/2012 [employer] Patient Information 2014 [employer].  

## 2013-08-02 ENCOUNTER — Encounter: Payer: Self-pay | Admitting: General Practice

## 2013-08-02 ENCOUNTER — Encounter: Payer: Self-pay | Admitting: *Deleted

## 2013-08-03 ENCOUNTER — Ambulatory Visit (INDEPENDENT_AMBULATORY_CARE_PROVIDER_SITE_OTHER): Payer: 59 | Admitting: Emergency Medicine

## 2013-08-03 VITALS — BP 126/82 | HR 112 | Temp 99.0°F | Resp 16 | Ht 65.75 in | Wt 162.0 lb

## 2013-08-03 DIAGNOSIS — J189 Pneumonia, unspecified organism: Secondary | ICD-10-CM

## 2013-08-03 DIAGNOSIS — J45909 Unspecified asthma, uncomplicated: Secondary | ICD-10-CM

## 2013-08-03 DIAGNOSIS — R112 Nausea with vomiting, unspecified: Secondary | ICD-10-CM

## 2013-08-03 MED ORDER — CEFTRIAXONE SODIUM 1 G IJ SOLR
1.0000 g | INTRAMUSCULAR | Status: DC
  Administered 2013-08-03: 1 g via INTRAMUSCULAR

## 2013-08-03 MED ORDER — ONDANSETRON 8 MG PO TBDP: 8.0000 mg | ORAL_TABLET | Freq: Three times a day (TID) | ORAL | Status: DC | PRN

## 2013-08-03 MED ORDER — LEVOFLOXACIN 500 MG PO TABS: 500.0000 mg | ORAL_TABLET | Freq: Every day | ORAL | Status: DC

## 2013-08-03 NOTE — Patient Instructions (Signed)
Matthew Brooks, Matthew Brooks (Asthma, Adult) Matthew Brooks l tnh tr?ng ?nh h??ng t?i ph?i. N c ??c ?i?m l s?ng v h?p ???ng th? c?ng nh? t?ng ti?t d?ch nh?y. H?p l do s?ng v co th?t c? bn trong ???ng th?. Khi ?i?u ny x?y ra, th? c th? kh kh?n v b?n c th? c ho, th? kh kh v kh th?. Hi?u bi?t nhi?u v? Matthew Brooks c th? gip b?n x? tr n t?t h?n. Khng th? ch?a kh?i Matthew Brooks, nh?ng thu?c v thay ??i cch s?ng c th? gip ki?m sot n. Matthew Brooks c th? l v?n ?? nh? ??i v?i m?t s? Matthew nh?ng n?u Matthew Brooks khng ???c ki?m sot , n c th? d?n t?i c?n Matthew ?e d?a ??n tnh m?ng. Matthew Brooks c th? thay ??i theo th?i gian. ?i?u quan tr?ng l c?n ph?i h?p v?i chuyn gia ch?m [place] s?c kh?e c?a b?n ?? x? tr cc tri?u ch?ng Matthew Brooks.  NGUYN NHN Khng r nguyn nhn chnh xc gy Matthew Brooks. Matthew Brooks ???c cho l do di truy?n (di truy?n) v ti?p xc v?i mi tr??ng. S?ng v t?y ?? (vim) ???ng th? x?y ra trong Matthew Brooks. ?i?u ny c th? b?t ??u b?i d? ?ng, nhi?m trng ph?i do vi rt, ho?c cc ch?t kch thch trong khng kh. Ph?n ?ng d? ?ng c th? khi?n b?n th? kh kh ngay l?p t?c ho?c vi gi? sau khi ti?p xc. Tc nhn lm kh?i pht Matthew Brooks khc nhau ??i v?i t?ng Matthew. ?i?u quan tr?ng l ph?i ch  v bi?t nguyn nhn lm kh?i pht Matthew Brooks. Tc nhn ph? bi?n gy ra c?n Matthew bao g?m:  B?i b?n t? da, tc ??ng v?t ho?c lng ??ng v?t.  M?t b?i c trong b?i trong nh.  Gin.  Ph?n hoa t? cy ho?c c?.  N?m m?c.  Thu?c l ho?c khi thu?c l. Khng th? cho php ht thu?c trong nh c Matthew b? Matthew Brooks. Matthew b? Matthew Brooks khng nn ht thu?c v khng nn ? g?n Matthew ht thu?c.  Cc ch?t  nhi?m khng kh nh? b?i, ch?t t?y r?a gia d?ng, ch?t x?t tc, ch?t phun x?t, h?i s?n, ha ch?t m?nh ho?c mi m?nh.  Khng kh l?nh ho?c thay ??i th?i ti?t. Khng kh l?nh c th? gy vim. Gi lm t?ng n?m m?c, ph?n hoa trong khng kh. Khng c m?t kh h?u t?t nh?t cho Matthew b? Matthew Brooks.  Nh?ng c?m xc m?nh nh? khc  ho?c c??i nhi?u.  C?ng th?ng.  M?t s? lo?i thu?c nh?t ??nh nh? aspirin ho?c thu?c ch?n beta.  Sunfit trong cc lo?i th?c ph?m v ?? u?ng nh? tri cy kh v r??u vang.  Cc b?nh nhi?m trng ho?c tnh tr?ng vim nhi?m nh? cm, c?m l?nh ho?c vim nim m?c m?i (vim m?i).  B?nh tro ng??c d? dy th?c qu?n (GERD). GERD l tnh tr?ng d?ch axit trong d? dy tro ng??c ln c? h?ng (th?c qu?n).  T?p luy?n ho?c ho?t ??ng n?ng. Thu?c tr??c khi t?p th? d?c thch h?p cho php h?u h?t m?i Matthew tham gia cc mn th? thao. TRI?U CH?NG  C?m th?y kh th?.  T?c ho?c ?au ng?c.  Kh ng? do ho, th? kh kh ho?c c?m th?y kh th?.  Ti?ng hut so hay th? kh kh khi th? ra.  Ho ho?c th? kh kh tr?m tr?ng h?n khi b?n:  B? nhi?m vi rt (nh? c?m l?nh ho?c cm).  ?ang b?  d? ?ng.  Ti?p xc v?i m?t s? lo?i h?i ho?c ha ch?t nh?t ??nh.  T?p luy?n. D?u hi?u cho th?y Matthew Brooks c th? tr? nn t? h?n bao g?m:  D?u hi?u v tri?u ch?ng c?a Matthew Brooks th??ng xuyn v gy c?m gic kh ch?u h?n.  Kh th? t?ng ln. Hi?n t??ng ny c th? ???c ?o b?ng my ?o l?u l??ng ??nh, ?y l m?t thi?t b? ??n gi?n ???c s? d?ng ?? ki?m tra xem ph?i c?a b?n ?ang ho?t ??ng nh? th? no.  Nhu c?u s? d?ng ?ng ht gi?m kh th? nhanh v?i t?n s? th??ng xuyn h?n. CH?N ?ON Vi?c ch?n ?on Matthew Brooks ???c th?c hi?n b?ng cch xem xt b?nh s? c?a b?n, khm th?c th? v c th? t? cc xt nghi?m khc. Nghin c?u ch?c n?ng ph?i c th? gip ch?n ?on. ?I?U TR? Khng th? ch?a kh?i Matthew Brooks. Tuy nhin, ??i v?i ?a ph?n Matthew Brooks, Matthew Brooks c th? ???c ki?m sot b?ng ?i?u tr?. Bn c?nh vi?c trnh cc tc nhn lm kh?i pht Matthew Brooks, th??ng c?n dng c? thu?c. C 2 lo?i thu?c ???c s? d?ng ?? ?i?u tr? Matthew Brooks: thu?c ki?m sot (gi?m vim v tri?u ch?ng) v thu?c lm gi?m ho?c thu?c gi?i c?u (gi?m tri?u ch?ng Matthew Brooks trong c?n Matthew c?p tnh). B?n c th? c?n dng thu?c hng ngy ?? ki?m sot Matthew Brooks. Cc lo?i thu?c ki?m sot lu di hi?u qu? nh?t  v?i Matthew Brooks l corticosteroid d?ng ht (ng?n ch?n vim). Cc lo?i thu?c ki?m sot lu di khc bao g?m:  Nhm thu?c khng leukotriene (ch?n con ???ng vim).  Nhm thu?c ?c ch? beta 2 tc d?ng ko di (gin c? ? ???ng th? t nh?t 12 gi?) km theo m?t lo?i corticosteroid d?ng ht.  Cromolyn natri ho?c nedocromil (lm thay ??i kh? n?ng gi?i phng cc ha ch?t gy vim c?a m?t s? t? bo b? vim).  Thu?c ?i?u ha mi?n d?ch (lm thay ??i h? th?ng mi?n d?ch ?? ng?n ch?n cc tri?u ch?ng Matthew Brooks).  Theophylline (lm gin c? trong ???ng th?). B?n c?ng c th? c?n ph?i dng m?t lo?i thu?c ?c ch? beta2 tc d?ng ng?n ?? lm gi?m cc tri?u ch?ng Matthew Brooks trong c?n Matthew c?p tnh. B?n nn hi?u ph?i lm g trong c?n Matthew c?p tnh. Thu?c d?ng ht c hi?u qu? khi ???c s? d?ng ?ng cch. ??c h??ng d?n v? cch s? d?ng thu?c c?a b?n m?t cch chnh xc v ni chuy?n v?i chuyn gia ch?m [place] s?c kh?e n?u b?n c cu h?i. ??n g?p chuyn gia ch?m [place] s?c kh?e ?? khm l?i theo ??nh k? ?? ??m b?o b?nh Matthew Brooks c?a b?n ???c ki?m sot t?t. N?u Matthew Brooks khng ???c ki?m sot t?t, n?u b?n b? nh?p vi?n v Matthew Brooks, ho?c n?u c?n nhi?u lo?i thu?c ho?c corticosteroid d?ng ht li?u t? trung bnh ??n li?u cao ?? ki?m sot b?nh Matthew Brooks c?a b?n, hy yu c?u gi?i thi?u ??n m?t chuyn gia v? b?nh Matthew Brooks. H??NG D?N CH?M [place] T?I NH  S? d?ng thu?c theo ch? d?n c?a chuyn gia ch?m [place] s?c kh?e.  Ki?m sot mi tr??ng trong nh c?a b?n theo nh?ng cch sau ?? gip ng?n ch?n c?n Matthew:  Thay b? l?c l s??i v ?i?u ha khng kh t nh?t m?t Brooks m?i thng.  ??t b? l?c ho?c mi?ng v?i th?a trn cc l? thng h?i c?a l s??i v ?i?u ha khng kh.  H?n ch? s? d?ng l s??i v b?p c?i.  Khng ht thu?c l. Khng ? nh?ng n?i c Matthew khc ?ang ht thu?c.  Di?t v?t gy h?i (ch?ng h?n nh? gin v chu?t) v phn c?a chng.  N?u b?n nhn th?y n?m m?c trn cy, hy v?t n ?i.  Lau sn nh v ht b?i m?i tu?n. S? d?ng s?n ph?m lm v? sinh  khng mi. S? d?ng my ht b?i s?ch h?n c b? l?c HEPA, n?u c th?. N?u vi?c ht b?i ho?c v? sinh lm kh?i pht Matthew Brooks, hy c? g?ng tm Matthew no khc ?? lm cc vi?c v?t ny.  Sn nh nn lm b?ng g?, ? lt ho?c nh?a vinyl. Th?m c th? dnh lng v b?i.  S? d?ng g?i, ga tr?i gi??ng v ga tr?i n?m c l xo ch?ng d? ?ng.  Gi?t kh?n tr?i gi??ng v ch?n hng tu?n b?ng n??c nng v lm kh b?ng my s?y.  S? d?ng ch?n ???c lm b?ng s?i t?ng h?p ho?c bng d?t s?i ch?t.  Khng s? d?ng di?m x?p n?p b?n trn gi??ng.  V? sinh phng t?m v nh b?p b?ng thu?c t?y v s?n l?i b?ng s?n ch?ng n?m m?c.  R?a tay th??ng xuyn.  Ni chuy?n v?i chuyn gia ch?m [place] s?c kh?e c?a b?n v? m?t k? ho?ch hnh ??ng ?? x? tr c?n Matthew. ?i?u ny bao g?m vi?c s? d?ng m?t my ?o l?u l??ng ??nh ?? ?o m?c ?? n?ng c?a c?n Matthew v cc lo?i thu?c c th? gip ng?n ch?n c?n Matthew. M?t k? ho?ch hnh ??ng c th? gip gi?m thi?u ho?c ng?n ch?n cc c?n Matthew m khng c?n ph?i ?i khm.  Gi? bnh t?nh trong c?n Matthew.  Lun c m?t k? ho?ch chu?n b? tham v?n v?i chuyn gia y t?. ?i?u ny bao g?m c? vi?c lin h? v?i chuyn gia ch?m [place] s?c kh?e c?a b?n v trong tr??ng h?p b? c?n Matthew n?ng, hy g?i d?ch v? c?p t?i ??a ph??ng ([place] K?). HY ?I KHM N?U:  B?n th? kh kh, kh th? ho?c ho ngay c? khi ? dng thu?c ?? ng?n ch?n c?n Matthew.  B?n c ?m ??c.  ?m c?a b?n thay ??i t? trong ho?c mu tr?ng sang mu vng, xanh, xm ho?c c mu.  B?n c b?t k? v?n ?? no lin quan ??n thu?c ?ang s? d?ng (ch?ng h?n nh? pht ban, ng?a, s?ng ho?c kh th?).  B?n s? d?ng thu?c lm gi?m Matthew Brooks nhi?u h?n 2-3 Brooks m?i tu?n.  L?u l??ng ??nh c?a b?n v?n ? kho?ng 50-79% ch? s? c nhn t?t nh?t sau khi lm theo k? ho?ch hnh ??ng c?a b?n trong 1 gi?. HY NGAY L?P T?C ?I KHM N?U:  B?n kh th? ngay c? khi ngh?Marland Kitchen  B?n kh th? khi th?c hi?n ho?t ??ng th? ch?t r?t t.  B?n g?p kh kh?n khi ?n, u?ng ho?c ni chuy?n do cc tri?u ch?ng Matthew Brooks.  B?n  b? ?au ng?c ho?c c?m th?y tim ??p nhanh.  Mi ho?c mng tay c?a b?n c mu xanh nh?t.  B?n b? chong vng, chng m?t ho?c ng?t x?u.  B?n b? s?t ho?c c cc tri?u ch?ng ko di h?n 2-3 ngy.  B?n b? s?t v cc tri?u ch?ng ??t nhin x?u ?i.  B?n d??ng nh? tr? nn n?ng h?n v khng ?p ?ng v?i vi?c ?i?u tr? trong khi c c?n Matthew.  L?u l??ng ??nh c?a b?n d??i 50% ch?  s? c nhn t?t nh?t. ??M B?O B?N:  Hi?u cc h??ng d?n ny.  S? theo di tnh tr?ng c?a mnh.  S? yu c?u tr? gip ngay l?p t?c n?u b?n c?m th?y khng ?? ho?c tnh tr?ng tr?m tr?ng h?n. Document Released: 02/19/2005 Document Revised: 10/22/2012 [facility] Patient Information 2014 [place], [place]. Bu?n Nn v Nn (Nausea and Vomiting) Bu?n nn l m?t c?m gic mu?n nn xu?t hi?n tr??c khi nn (i m?a). Nn l m?t ph?n x? trong ? cc ch?t trong d? dy ra kh?i mi?ng b?n. Nn c th? gy ra m?t n??c trong c? th? nghim tr?ng (m?t n??c). Tr? em v Matthew cao tu?i c th? b? m?t n??c nhanh chng, ??c bi?t n?u h? b? c? tiu ch?y. Bu?n nn v nn l tri?u ch?ng c?a m?t tnh tr?ng ho?c b?nh. ?i?u quan tr?ng l tm ra nguyn nhn gy ra cc tri?u ch?ng. NGUYN NHN  Tr?c ti?p kch thch nim m?c d? dy. S? kch thch ny c th? do t?ng l??ng axit (tro ng??c d? dy th?c qu?n), nhi?m trng, ng? ??c th?c ph?m, dng m?t s? lo?i thu?c nh?t ??nh (nh? thu?c ch?ng vim khng c steroid), s? d?ng r??u ho?c s? d?ng thu?c l.  Tn hi?u t? no. Nh?ng tn hi?u ny c th? do ?au ??u, ti?p xc v?i s?c nng, r?i lo?n ? tai trong, t?ng p l?c trong no sau ch?n th??ng, nhi?m trng, kh?i u ho?c ch?n ??ng, ?au, kch thch v? m?t c?m xc ho?c cc v?n ?? chuy?n ha.  T?c ngh?n ? ???ng tiu ha (t?c ngh?n ru?t).  Cc b?nh nh? ti?u ???ng, vim gan, v?n ?? v? ti m?t, vim ru?t th?a, v?n ?? v? th?n, ung th?, nhi?m khu?n huy?t, cc tri?u ch?ng khng ?i?n hnh c?a m?t c?n nh?i mu c? tim ho?c r?i lo?n ?n u?ng.  ?i?u tr? n?i khoa nh? ha tr? li?u v x? tr?.  ?ang  dng thu?c lm cho b?n ng? (gy m ton thn) trong khi ph?u thu?t. CH?N ?ON Chuyn gia ch?m [place] s?c kh?e c th? yu c?u ti?n hnh cc xt nghi?m n?u v?n ?? khng c?i thi?n sau m?t vi ngy. Cc xt nghi?m c?ng c th? ???c th?c hi?n n?u c cc tri?u ch?ng n?ng ho?c n?u l do gy bu?n nn v nn khng r rng. Cc xt nghi?m c th? bao g?m:  Xt nghi?m n??c ti?u.  Xt nghi?m mu.  Xt nghi?m phn.  C?y m?u (?? tm b?ng ch?ng nhi?m trng).  Ch?p X-quang ho?c cc nghin c?u hnh ?nh khc. K?t qu? xt nghi?m c th? gip chuyn gia ch?m [place] s?c kh?e c?a b?n ??a ra quy?t ??nh v? ?i?u tr? ho?c c?n ph?i th?c hi?n thm cc xt nghi?m khc. ?I?U TR? B?n c?n gi? ? tr?ng thi ?? n??c t?t. U?ng th??ng xuyn nh?ng v?i s? l??ng nh?. B?n c th? mu?n u?ng n??c, ?? u?ng th? thao, n??c dng trong, ho?c ?n kem ?ng l?nh ho?c mn trng mi?ng gelatin ?? gi? ?? n??c. Khi b?n ?n, nn ?n ch?m c th? gip ng?n ng?a bu?n nn. Ngoi ra cn c m?t s? thu?c ch?ng nn c th? gip ng?n ng?a bu?n nn. H??NG D?N CH?M [place] T?I NH  S? d?ng t?t c? thu?c theo ch? d?n c?a chuyn gia ch?m [place] s?c kh?e.  N?u b?n khng thm ?n, khng nn p bu?c mnh ?n. Tuy nhin, b?n ph?i ti?p t?c u?ng n??c.  N?u b?n thm ?n, hy ?n m?t ch? ?? ?n bnh th??ng, tr? khi  chuyn gia ch?m [place] s?c kh?e c?a b?n c ch? d?n khc.  ?n nhi?u lo?i hi?rat [place] ph?c t?p (g?o, la m, khoai ty, bnh m), th?t n?c, s?a chua, tri cy v rau qu?Marland Kitchen  Hessie Diener cc lo?i th?c ph?m c hm l??ng ch?t bo cao v chng kh tiu ha h?n.  U?ng ?? n??c v dung d?ch ?? n??c ti?u trong ho?c c mu vng nh?t.  N?u b?n b? m?t n??c, hy h?i chuyn gia ch?m [place] s?c kh?e c?a b?n ?? ???c h??ng d?n b n??c c? th?. D?u hi?u m?t n??c c th? bao g?m:  R?t kht.  Mi v mi?ng kh.  Hoa m?t.  N??c ti?u x?m mu.  Gi?m t?n su?t v l??ng n??c ti?u.  L? Brooks.  Th? ho?c M?ch nhanh. HY NGAY L?P T?C ?I KHM N?U:  C mu ho?c ??m nu (gi?ng nh? b c ph) trong ch?t nn c?a  b?n.  Phn b?n c mu ?en ho?c c mu.  B?n b? ?au ??u ho?c c?ng c? nhi?u.  B?n b? l Brooks.  B?n b? ?au b?ng n?ng.  B?n b? ?au ng?c ho?c kh th?.  B?n khng ?i ti?u t nh?t m?t Brooks m?i 8 gi?.  Da b?n l?nh ho?c l?nh v ?m.  B?n ti?p t?c nn ko di h?n 25 ??n 49 gi?.  B?n b? s?t. ??M B?O B?N:  Hi?u cc h??ng d?n ny.  S? theo di tnh tr?ng c?a mnh.  S? yu c?u tr? gip ngay l?p t?c n?u b?n c?m th?y khng ?? ho?c tnh tr?ng tr?m tr?ng h?n. Document Released: 09/12/2010 Document Revised: 10/22/2012 [facility] Patient Information 2014 [place], [place]. B?nh Vim Ph?i ? Matthew Brooks (Pneumonia, Adult) Vim ph?i l b?nh nhi?m trng ph?i.  NGUYN NHN N c th? do vi khu?n ho?c vi rt. Thng th??ng, cc nhi?m trng ny l do ht ph?i cc h?t b? nhi?m trng vo ph?i (???ng h h?p). TRI?U CH?NG  Ho.  S?t.  ?au ng?c.  Th? nhanh.  Kh kh.  Kh?c ??m. CH?N ?ON N?u b?n c cc tri?u ch?ng ph? bi?n c?a b?nh vim ph?i, chuyn gia ch?m [place] s?c kh?e c?a b?n th??ng s? xc ??nh ch?n ?on b?ng ch?p X-quang ng?c. X-quang s? hi?n th? ph?n b?t th??ng trong ph?i (thm nhi?m ph?i) n?u b?n b? vim ph?i. Cc xt nghi?m mu, n??c ti?u ho?c ??m c th? ???c th?c hi?n ?? tm ra nguyn nhn c? th? gy b?nh vim ph?i c?a b?n. Chuyn gia ch?m [place] s?c kh?e c?a b?n c?ng c th? lm cc xt nghi?m (?o kh trong mu ho?c ?o ?? bo ha -xy trong mu) ?? xem ph?i c?a b?n ?ang ho?t ??ng nh? th? no. ?I?U TR? M?t s? d?ng vim ph?i c th? ly lan sang Matthew khc khi b?n ho ho?c h?t h?i. B?n c th? ???c yu c?u ?eo kh?u trang tr??c v trong qu trnh khm. Vim ph?i do vi khu?n ???c ?i?u tr? b?ng thu?c khng sinh. Vim ph?i do vi rt cm c th? ???c ?i?u tr? b?ng thu?c khng vi rt. H?u h?t cc b?nh nhi?m vi rt khc ph?i di?n ra h?t qu trnh c?a b?nh. Cc b?nh nhi?m trng ny s? khng ?p ?ng v?i thu?c khng sinh. PHNG NG?A Tim phng (v?c-xin) ph? c?u khu?n c th? s? d?ng ???c ?? phng ng?a vim ph?i do ph?  c?u khu?n. ?i?u ny th??ng ???c ?? ngh? cho:  Nh?ng Matthew trn 66 tu?i.  B?nh nhn ha tr? li?u.  Nh?ng Matthew c v?n ?? v?  ph?i m?n tnh, ch?ng h?n nh? vim ti?u ph? qu?n ho?c kh ph? th?ng.  Nh?ng Matthew c v?n ?? h? th?ng mi?n d?ch. N?u b?n trn 66 tu?i ho?c c m?t tnh tr?ng nguy c? cao, b?n c th? ???c tim v?cxin ph? c?u khu?n n?u b?n khng ???c tim n tr??c ?. ? m?t s? n??c, v?cxin cm th??ng xuyn c?ng ???c ?? ngh?Marland Kitchen V?cxin ny c th? gip phng ng?a m?t s? tr??ng h?p vim ph?i. B?n c th? s? ???c tim v?cxin cm l m?t ph?n c?a vi?c ch?m [place]. N?u b?n ht thu?c, ? t?i lc b? thu?c. B?n c th? ???c h??ng d?n v? cch d?ng ht thu?c t?t nh?t. Chuyn gia ch?m [place] s?c kh?e c?a b?n c th? cung c?p thu?c ?i?u tr? v t? v?n ?? gip b?n b? thu?c l. H??NG D?N CH?M [place] T?I NH  Thu?c ho c th? ???c s? d?ng n?u b?n khng ???c ngh? ng?i nhi?u. Tuy nhin, ho b?o v? b?n b?ng cch lm s?ch ph?i. B?n nn trnh s? d?ng thu?c gi?m ho n?u c th?Paulino Rily gia ch?m [place] s?c kh?e c?a b?n c th? ? k ??n thu?c n?u ngh? r?ng b?n b? vim ph?i do vi khu?n ho?c cm. Hy dng h?t thu?c ngay c? khi b?n b?t ??u c?m th?y kh h?n.  Chuyn gia ch?m [place] s?c kh?e ? c?ng c th? k ??n thu?c long ??m Thu?c ny c tc d?ng lm long ??m ?? ho ra.  Ch? dng cc thu?c khng c?n k toa ho?c thu?c c?n k toa ?? gi?m ?au, gi?m c?m gic kh ch?u, ho?c h? s?t theo nh? h??ng d?n c?a chuyn gia ch?m [place] s?c kh?e.  Khng ht thu?c l. Ht thu?c l l nguyn nhn ph? bi?n gy vim ph? qu?n v c th? gp ph?n vo vim ph?i. N?u b?n ht thu?c v ti?p t?c ht thu?c, ch?ng ho c?a b?n c th? ko di vi tu?n sau khi h?t vim ph?i.  My phun h?i n??c mt ho?c my t?o h?i ?m trong phng hay nh b?n c th? gip lm long d?ch nh?y.  Ho th??ng n?ng h?n vo ban ?m. Ng? ? t? th? n?a n?m n?a ng?i trong m?t chi?c gh? t?a ho?c s? d?ng m?t ?i g?i d??i ??u s? gip lm d?u b?t v?n ?? ny.  Ngh? ng?i khi b?n c?m th?y c?n. C? th? c?a b?n th??ng s?  cho b?n bi?t khi no c?n ngh? ng?i. HY NGAY L?P T?C ?I KHM N?U:  B?nh c?a b?n tr? nn t? h?n. ?i?u ny ??c bi?t ?ng n?u b?n ? c tu?i ho?c b? y?u do b?t k? b?nh no khc.  B?n khng th? ki?m sot b?nh ho b?ng thu?c gi?m ho v m?t ng?.  B?n b?t ??u ho ra mu.  B?n ngy cng ?au h?n ho?c u?ng thu?c khng c tc d?ng gi?m ?au.  B?n b? s?t.  B?t k? tri?u ch?ng no ban ??u ??a b?n ??n ?i?u tr? tr? nn nghim tr?ng h?n thay v t?t h?n.  B?n b? kh th? ho?c ?au ng?c. HY CH?C CH?N R?NG B?N:  Hi?u r nh?ng h??ng d?n ny.  S? theo di tnh tr?ng b?nh c?a b?n.  S? yu c?u tr? gip ngay l?p t?c n?u b?n khng ?? ho?c tnh tr?ng tr?m tr?ng h?n. Document Released: 02/19/2005 Document Revised: 12/10/2012 [facility] Patient Information 2014 [place], [place].

## 2013-08-03 NOTE — Progress Notes (Signed)
[facility] [address] [place] [address] [phone]  Date:  08/03/2013   Name:  Matthew Brooks   DOB:  20-Feb-1976   MRN:  [id number]  PCP:  No PCP Per Patient    Chief Complaint: Abdominal Pain, Emesis, Fever and Fatigue   History of Present Illness:  Matthew Brooks is a 38 y.o. very pleasant male patient who presents with the following:  Treated with augmentin for pneumonia Friday.  Since starting the augmentin, he has had nausea and vomiting. His fever has diminished and he has less sputum production.  Nauseated.  No vomiting today.  No stool change or rash.  Poor po intake and fatigue.  Denies other complaint or health concern today.   There are no active problems to display for this patient.   Past Medical History  Diagnosis Date  . Asthma     History reviewed. No pertinent past surgical history.  History  Substance Use Topics  . Smoking status: Never Smoker   . Smokeless tobacco: Not on file  . Alcohol Use: No    History reviewed. No pertinent family history.  No Known Allergies  Medication list has been reviewed and updated.  Current Outpatient Prescriptions on File Prior to Visit  Medication Sig Dispense Refill  . albuterol (PROVENTIL HFA) 108 (90 BASE) MCG/ACT inhaler Inhale 2 puffs into the lungs every 6 (six) hours as needed.  1 Inhaler  12  . amoxicillin-clavulanate (AUGMENTIN) 875-125 MG per tablet Take 1 tablet by mouth 2 (two) times daily.  20 tablet  0  . HYDROcodone-homatropine (HYCODAN) 5-1.5 MG/5ML syrup Take 5 mLs by mouth every 8 (eight) hours as needed for cough.  120 mL  0  . albuterol (PROVENTIL) (2.5 MG/3ML) 0.083% nebulizer solution Take 3 mLs (2.5 mg total) by nebulization every 6 (six) hours as needed for wheezing.  75 mL  11   Current Facility-Administered Medications on File Prior to Visit  Medication Dose Route Frequency Provider Last Rate Last Dose  . acetaminophen (TYLENOL) tablet 975 mg  294 mg Oral Once Beatriz Chancellor, PA-C        Review of Systems:  As per HPI, otherwise negative.    Physical Examination: Filed Vitals:   08/03/13 0914  BP: 126/82  Pulse: 112  Temp: 99 F (37.2 C)  Resp: 16   Filed Vitals:   08/03/13 0914  Height: 5' 5.75" (1.67 m)  Weight: 162 lb (73.483 kg)   Body mass index is 26.35 kg/(m^2). Ideal Body Weight: Weight in (lb) to have BMI = 25: 153.4  GEN: WDWN, NAD, Non-toxic, A & O x 3 HEENT: Atraumatic, Normocephalic. Neck supple. No masses, No LAD. Ears and Nose: No external deformity. CV: RRR, No M/G/R. No JVD. No thrill. No extra heart sounds. PULM: CTA B, scattered wheezes, no crackles, rhonchi. No retractions. No resp. distress. No accessory muscle use. ABD: S, NT, ND, +BS. No rebound. No HSM. EXTR: No c/c/e NEURO Normal gait.  PSYCH: Normally interactive. Conversant. Not depressed or anxious appearing.  Calm demeanor.    Assessment and Plan: Resolving pneumonia Intolerance to augmentin Stop augmentin  Start levaquin tomorrow Rocephin today   Signed,  Ellison Carwin, MD

## 2013-08-09 ENCOUNTER — Ambulatory Visit (INDEPENDENT_AMBULATORY_CARE_PROVIDER_SITE_OTHER): Payer: 59 | Admitting: Family Medicine

## 2013-08-09 ENCOUNTER — Ambulatory Visit: Payer: 59

## 2013-08-09 VITALS — BP 106/76 | HR 82 | Temp 97.3°F | Resp 20 | Ht 65.0 in | Wt 157.8 lb

## 2013-08-09 DIAGNOSIS — R062 Wheezing: Secondary | ICD-10-CM

## 2013-08-09 DIAGNOSIS — J189 Pneumonia, unspecified organism: Secondary | ICD-10-CM

## 2013-08-09 NOTE — Progress Notes (Signed)
This is a 38 year old gentleman seen with his wife today for followup of pneumonia which was initially treated on 07/31/2013. At that time he was started on Augmentin. Nausea worse the change in medicine to Levaquin and is unwell since.  Patient continues to cough some. He has had no fever, chest pain, or shortness of breath. He is scheduled to return to work at the [employer].  Nausea and vomiting completely resolved. Tolerating the Levaquin well.   Objective: No acute distress HEENT: Unremarkable Neck: Supple no adenopathy Chest: Few rales in the left base with some expiratory wheezing on the left side as well. Heart: Regular no murmur Abdomen: Soft nontender Extremities: No edema, moving 4 extremities well Skin: Normal color, no rash CLINICAL DATA: Fever, emesis, diarrhea.  EXAM:  CHEST 2 VIEW on Jul 31, 2013 COMPARISON: Chest radiograph Jul 04, 2011.  FINDINGS:  Patchy left lower lobe airspace opacity. No pleural effusions.  Cardiomediastinal silhouette is unremarkable. No pneumothorax. Soft  tissue planes and included osseous structures are nonsuspicious.  IMPRESSION:  Left lower lobe airspace opacity concerning for pneumonia. Recommend  followup chest radiograph after treatment to verify improvement.  Electronically Signed  By: Elon Alas  On: 07/31/2013 17:22   [facility] reading (PRIMARY) by  Dr. Joseph Art today chest x-ray shows resolution of the pneumonia  Assessment: Patient markedly improved symptomatically and x-rays show some clearing as well. 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  Plan: Continue the albuterol inhaler 4 times a day when necessary ?, Robyn Haber and

## 2014-03-02 ENCOUNTER — Ambulatory Visit (INDEPENDENT_AMBULATORY_CARE_PROVIDER_SITE_OTHER): Payer: 59 | Admitting: Emergency Medicine

## 2014-03-02 VITALS — BP 114/68 | HR 105 | Temp 98.0°F | Resp 17 | Ht 66.0 in | Wt 176.0 lb

## 2014-03-02 DIAGNOSIS — R059 Cough, unspecified: Secondary | ICD-10-CM

## 2014-03-02 DIAGNOSIS — J4541 Moderate persistent asthma with (acute) exacerbation: Secondary | ICD-10-CM

## 2014-03-02 DIAGNOSIS — R05 Cough: Secondary | ICD-10-CM

## 2014-03-02 MED ORDER — ALBUTEROL SULFATE (2.5 MG/3ML) 0.083% IN NEBU
5.0000 mg | INHALATION_SOLUTION | Freq: Once | RESPIRATORY_TRACT | Status: AC
  Administered 2014-03-02: 5 mg via RESPIRATORY_TRACT

## 2014-03-02 MED ORDER — IPRATROPIUM BROMIDE 0.02 % IN SOLN
0.5000 mg | Freq: Once | RESPIRATORY_TRACT | Status: AC
  Administered 2014-03-02: 0.5 mg via RESPIRATORY_TRACT

## 2014-03-02 MED ORDER — BUDESONIDE-FORMOTEROL FUMARATE 80-4.5 MCG/ACT IN AERO: 2.0000 | INHALATION_SPRAY | Freq: Two times a day (BID) | RESPIRATORY_TRACT | Status: DC

## 2014-03-02 MED ORDER — ALBUTEROL SULFATE HFA 108 (90 BASE) MCG/ACT IN AERS: 2.0000 | INHALATION_SPRAY | RESPIRATORY_TRACT | Status: DC | PRN

## 2014-03-02 NOTE — Patient Instructions (Signed)
Hen suy?n (Asthma) Hen suy?n l m?t tnh tr?ng b?nh l ti pht trong ?, ???ng h h?p b? th?t ch?t v h?p l?i. Hen suy?n c th? gy kh th?. N c th? gy ho, th? kh kh, v kh th? Cc ??t hen, hay cn g?i l cc c?n hen, c th? ? m?c ?? t? nh? ??n nguy hi?m ??n tnh m?ng. Khng th? ch?a kh?i ???c hen suy?n, nh?ng thu?c v thay ??i l?i s?ng c th? gip ki?m sot b?nh ny. NGUYN NHN Hen ???c cho l do cc y?u t? ???c th?a h??ng t? th? h? tr??c (di truy?n) v mi tr??ng, nh?ng nguyn nhn chnh xc v?n ch?a ???c xc ??nh. Hen c th? b? kh?i pht b?i cc d? ?ng nguyn, nhi?m trng ph?i, ho?c cc ch?t kch thch trong khng kh. Tc nhn gy hen suy?n khc nhau v?i m?i ng??i. Tc nhn ph? bi?n gy kh?i pht b?nh bao g?m:   V?y da ??ng v?t.  M?t b?i nh.  Gin.  Ph?n hoa c?a cy ho?c c?.  N?m m?c.  Khi thu?c.  Cc ch?t  nhi?m khng kh nh? b?i, ch?t t?y r?a ?? gia d?ng, ch?t x?t tc, ch?t phun x?t, h?i s?n, ha ch?t m?nh ho?c mi m?nh.  Khng kh l?nh, thay ??i th?i ti?t, v gi (lm t?ng l??ng n?m m?c v ph?n hoa trong khng kh).  Bi?u l? c?m xc m?nh nh? khc ho?c c??i nhi?u.  C?ng th?ng.  M?t s? lo?i thu?c nh?t ??nh (nh? aspirin) ho?c cc lo?i thu?c (nh? thu?c ch?n b ta).  Ch?t sunfit trong th?c ?n v ?? u?ng. Th?c ?n v ?? u?ng c th? c ch?t sunfit, bao g?m tri cy kh, khoai ty chin, v n??c nho c ga.  Cc tnh tr?ng nhi?m trng ho?c vim nh? cm, c?m l?nh ho?c vim nim m?c m?i (vim m?i ).  B?nh tro ng??c d? dy th?c qu?n (GERD).  T?p luy?n ho?c ho?t ??ng c?ng th?ng. TRI?U CH?NG Cc tri?u ch?ng c th? di?n ra ngay sau khi hen suy?n kh?i pht ho?c nhi?u gi? sau ?. Cc tri?u ch?ng bao g?m:  Th? kh kh.  Ho nhi?u vo ban ?m ho?c bu?i sng s?m.  Ho th??ng xuyn ho?c ho d? d?i khi b? c?m l?nh thng th??ng.  T?c ng?c.  Kh th?. CH?N ?ON  Ch?n ?on hen suy?n ???c th?c hi?n b?ng cch xem xt b?nh s? c?a qu v? v khm th?c th?. C?ng c th? ph?i lm cc  ph?n ki?m tra. Nh?ng ki?m tra ny c th? bao g?m:  Nghin c?u ch?ng n?ng ph?i. Ki?m tra ny cho bi?t qu v? ht vo v th? ra ???c bao nhiu khng kh.  Ki?m tra d? ?ng.  Ki?m tra b?ng hnh ?nh nh? ch?p X quang. ?I?U TR?  Hen suy?n khng th? ch?a kh?i ???c, nh?ng th??ng c th? ki?m sot ???c. ?i?u tr? bao g?m vi?c xc ??nh v phng trnh cc tc nhn gy kh?i pht hen suy?n cho qu v?. ?i?u tr? c?ng c th? bao g?m c? thu?c. C 2 lo?i thu?c ???c s? d?ng ?? ?i?u tr? hen suy?n:   Cc thu?c ki?m sot. Cc thu?c ny ng?n ng?a khng cho cc tri?u ch?ng hen suy?n x?y ra. Thu?c th??ng ???c dng m?i ngy.  Thu?c lm gi?m nh? ho?c c?p c?u. Nh?ng thu?c ny nhanh chng lm gi?m cc tri?u ch?ng hen suy?n. Cc thu?c ? th??ng ???c dng khi c?n thi?t v ?? gi?m nh? tri?u ch?ng trong th?i gian ng?n. [employer]  gia ch?m [place] s?c kh?e s? gip qu v? xy d?ng m?t k? ho?ch th?c hi?n vi?c ki?m sot hen suy?n. M?t k? ho?ch th?c hi?n vi?c ki?m sot hen suy?n l m?t k? ho?ch ???c ??a ra ?? qu?n l v ?i?u tr? cc c?n hen suy?n c?a qu v?. N bao g?m m?t danh sch cc tc nhn gy kh?i pht hen suy?n v cch c th? phng trnh [place]. K? ho?ch ? c?ng bao g?m thng tin v? vi?c khi no c?n dng thu?c v khi no c?n thay ??i li?u thu?c. M?t k? ho?ch th?c hi?n c?ng c th? bao g?m vi?c s? d?ng m?t d?ng c? g?i l l?u l??ng ??nh k?. L?u l??ng ??nh k? ?o kh? n?ng ho?t ??ng c?a ph?i. N gip gim st tnh tr?ng c?a qu v?. H??NG D?N CH?M [place] T?I NH   Ch? s? d?ng thu?c theo ch? d?n c?a chuyn gia ch?m [place] s?c kh?e. Hy ni v?i chuyn gia ch?m [place] s?c kh?e n?u qu v? c cc cu h?i v? cch th?c v th?i gian dng thu?c.  S? d?ng l?u l??ng ??nh k? theo ch? d?n c?a chuyn gia ch?m [place] s?c kh?e. Ghi l?i v theo di cc k?t qu? ?o.  Hi?u v s? d?ng k? ho?ch th?c hi?n ?? gip gi?m thi?u ho?c ng?n ch?n c?n hen m khng c?n ph?i ?i khm.  Ki?m sot mi tr??ng trong nh c?a qu v? theo nh?ng cch sau ?? gip ng?n ch?n c?n hen:  Khng ht  thu?c. Hessie Diener b? ti?p xc v?i khi thu?c th? ??ng.  Thay b? l?c l s??i v ?i?u ho khng kh th??ng xuyn.  H?n ch? s? d?ng l s??i v b?p c?i.  Di?t v?t gy h?i (ch?ng h?n nh? gin v chu?t) v lo?i b? phn c?a chng.  V?t b? cy n?u qu v? th?y chng b? n?m m?c.  Th??ng xuyn lm s?ch sn nh v b?i b?n. S? d?ng s?n ph?m lm s?ch khng mi.  C? g?ng nh? m?t ng??i khc th??ng xuyn ht b?i cho qu v?. Khng vo cc phng trong lc ?ang ht b?i v m?t lc sau ?Marland Kitchen N?u qu v? ht b?i, hy s? d?ng m?t m?t n? ch?ng b?i mua ? c?a hng ?? dng trong nh, m?t ti ch?a b?i hai l?p ho?c ti c b? l?c trong my ht b?i, ho?c m?t my ht b?i c b? l?c HEPA.  Thay th? th?m b?ng sn g?, ? lt ho?c nh?a vinyl. Th?m c th? dnh lng v b?i.  S? d?ng g?i, ga tr?i gi??ng v ga tr?i n?m l xo ch?ng d? ?ng.  Gi?t kh?n tr?i gi??ng v ch?n hng tu?n b?ng n??c nng v dng my s?y s?y kh.  S? d?ng ch?n lm b?ng polyester ho?c v?i bng.  Lm s?ch nh v? sinh v b?p b?ng ch?t t?y. N?u c th?, hy s?n l?i t??ng trong cc phng ny b?ng lo?i s?n ch?ng m?c. Trnh xa cc phng ?ang ???c lm s?ch v ?ang ???c s?n.  R?a tay th??ng xuyn. ?I [facility] N?U:   Qu v? th? kh kh, kh th? ho?c ho ngay c? khi ? dng thu?c ?? ng?n ch?n c?n hen.  Ch?t nhy c mu m qu v? ho ra (??m) ??c h?n bnh th??ng.  ??m c?a qu v? thay ??i t? khng mu (trong) ho?c mu tr?ng sang mu vng, xanh l cy, xm ho?c c mu.  Qu v? c b?t k? v?n ?? no lin quan ??n thu?c ?ang s? d?ng (ch?ng h?n nh?  pht ban, ng?a, s?ng ho?c kh th?).  Qu v? s? d?ng thu?c lm gi?m hen suy?n nhi?u h?n 2-3 l?n m?i tu?n.  L?u l??ng ??nh c?a qu v? v?n ? kho?ng 50-79% ch? s? c nhn t?t nh?t sau khi lm theo k? ho?ch th?c hi?n c?a qu v? trong 1 ti?ng.  Qu v? b? s?t. NGAY L?P T?C ?I [facility] N?U:   Qu v? d??ng nh? b? n?ng h?n v khng ?p ?ng v?i vi?c ?i?u tr? trong c?n hen.  Qu v? kh th? ngay c? khi ngh?Sander Nephew v? kh th? khi th?c hi?n ho?t  ??ng th? ch?t r?t nh?Sander Nephew v? g?p kh kh?n khi ?n, u?ng ho?c ni chuy?n do cc tri?u ch?ng hen suy?n.  Qu v? b? ?au ng?c.  Qu v? c nh?p tim nhanh.  Mi ho?c mng tay c?a qu v? c mu xanh nh?t.  Qu v? b? chong vng, chng m?t ho?c ng?t.  L?u l??ng ??nh c?a qu v? d??i 50% ch? s? c nhn t?t nh?t. ??M B?O QU V?:   Hi?u r cc h??ng d?n ny.  S? theo di tnh tr?ng c?a mnh.  S? yu c?u tr? gip ngay l?p t?c n?u qu v? c?m th?y khng kh?e ho?c th?y tr?m tr?ng h?n. Document Released: 02/19/2005 Document Revised: 07/06/2013 [facility] Patient Information 2015 [place], [place]. This information is not intended to replace advice given to you by your health care provider. Make sure you discuss any questions you have with your health care provider.

## 2014-03-02 NOTE — Addendum Note (Signed)
Addended by: Valerie Salts on: 03/02/2014 11:28 AM   Modules accepted: Orders

## 2014-03-02 NOTE — Progress Notes (Signed)
[facility] [address], [place] [address] [phone]  Date:  03/02/2014   Name:  Matthew Brooks   DOB:  1975/03/12   MRN:  [id number]  PCP:  No PCP Per Patient    Chief Complaint: Shortness of Breath   History of Present Illness:  Matthew Brooks is a 38 y.o. very pleasant male patient who presents with the following:  Patient has a history of asthma and ran out of his MDI in September.  Treated a number of times for pneumonia Now concerned by his increased asthma and shortness of breath that he may have pneumonia again No cough, no fever or chills. No coryza. No nausea or vomiting No improvement with over the counter medications or other home remedies. .. Denies other complaint or health concern today.   There are no active problems to display for this patient.   Past Medical History  Diagnosis Date  . Asthma     No past surgical history on file.  History  Substance Use Topics  . Smoking status: Never Smoker   . Smokeless tobacco: Not on file  . Alcohol Use: No    No family history on file.  Allergies  Allergen Reactions  . Augmentin [Amoxicillin-Pot Clavulanate] Nausea And Vomiting    Medication list has been reviewed and updated.  Current Outpatient Prescriptions on File Prior to Visit  Medication Sig Dispense Refill  . albuterol (PROVENTIL HFA) 108 (90 BASE) MCG/ACT inhaler Inhale 2 puffs into the lungs every 6 (six) hours as needed. (Patient not taking: Reported on 03/02/2014) 1 Inhaler 12   Current Facility-Administered Medications on File Prior to Visit  Medication Dose Route Frequency Provider Last Rate Last Dose  . acetaminophen (TYLENOL) tablet 975 mg  683 mg Oral Once Beatriz Chancellor, PA-C      . cefTRIAXone (ROCEPHIN) injection 1 g  1 g Intramuscular Q24H Roselee Culver, MD   1 g at 08/03/13 1002    Review of Systems:  As per HPI, otherwise negative.    Physical Examination: Filed Vitals:   03/02/14 1021  BP:  114/68  Pulse: 105  Temp: 98 F (36.7 C)  Resp: 17   Filed Vitals:   03/02/14 1021  Height: 5\' 6"  (1.676 m)  Weight: 176 lb (79.833 kg)   Body mass index is 28.42 kg/(m^2). Ideal Body Weight: Weight in (lb) to have BMI = 25: 154.6  GEN: WDWN, NAD, Non-toxic, A & O x 3 HEENT: Atraumatic, Normocephalic. Neck supple. No masses, No LAD. Ears and Nose: No external deformity. CV: RRR, No M/G/R. No JVD. No thrill. No extra heart sounds. PULM: diffuse  Wheezes, no crackles, rhonchi. No retractions. No resp. distress. No accessory muscle use. ABD: S, NT, ND, +BS. No rebound. No HSM. EXTR: No c/c/e NEURO Normal gait.  PSYCH: Normally interactive. Conversant. Not depressed or anxious appearing.  Calm demeanor.    Assessment and Plan: Exacerbation asthma Noncompliant Poor understanding of asthma dulera  Albuterol  Signed,  Ellison Carwin, MD

## 2014-04-22 ENCOUNTER — Telehealth: Payer: Self-pay

## 2014-04-22 NOTE — Telephone Encounter (Signed)
PA started on Symbicort, awaiting reply.

## 2015-03-27 ENCOUNTER — Ambulatory Visit (INDEPENDENT_AMBULATORY_CARE_PROVIDER_SITE_OTHER): Payer: 59

## 2015-03-27 ENCOUNTER — Ambulatory Visit (INDEPENDENT_AMBULATORY_CARE_PROVIDER_SITE_OTHER): Payer: 59 | Admitting: Family Medicine

## 2015-03-27 VITALS — BP 128/74 | HR 135 | Temp 98.1°F | Resp 20 | Ht 65.5 in | Wt 172.0 lb

## 2015-03-27 DIAGNOSIS — R05 Cough: Secondary | ICD-10-CM | POA: Diagnosis not present

## 2015-03-27 DIAGNOSIS — J4531 Mild persistent asthma with (acute) exacerbation: Secondary | ICD-10-CM

## 2015-03-27 DIAGNOSIS — R059 Cough, unspecified: Secondary | ICD-10-CM

## 2015-03-27 MED ORDER — PREDNISONE 20 MG PO TABS: 40.0000 mg | ORAL_TABLET | Freq: Every day | ORAL | Status: DC

## 2015-03-27 MED ORDER — IPRATROPIUM BROMIDE 0.02 % IN SOLN
0.5000 mg | Freq: Once | RESPIRATORY_TRACT | Status: AC
  Administered 2015-03-27: 0.5 mg via RESPIRATORY_TRACT

## 2015-03-27 MED ORDER — ALBUTEROL SULFATE (2.5 MG/3ML) 0.083% IN NEBU
2.5000 mg | INHALATION_SOLUTION | Freq: Once | RESPIRATORY_TRACT | Status: AC
  Administered 2015-03-27: 2.5 mg via RESPIRATORY_TRACT

## 2015-03-27 MED ORDER — AZITHROMYCIN 250 MG PO TABS: ORAL_TABLET | ORAL | Status: DC

## 2015-03-27 MED ORDER — BECLOMETHASONE DIPROPIONATE 80 MCG/ACT IN AERS: 1.0000 | INHALATION_SPRAY | Freq: Two times a day (BID) | RESPIRATORY_TRACT | Status: DC

## 2015-03-27 MED ORDER — ALBUTEROL SULFATE HFA 108 (90 BASE) MCG/ACT IN AERS: 2.0000 | INHALATION_SPRAY | RESPIRATORY_TRACT | Status: DC | PRN

## 2015-03-27 NOTE — Patient Instructions (Addendum)
Start new inhaler Qvar twice per day to treat asthma and albuterol up to every 4 hours, but only if needed. Start azithromycin to treat possible infection, and prednisone 2 pills per day for 3 days for this current asthma flare.  Return for recheck in next 2 months to make sure asthma is controlled.  Return to the clinic or go to the nearest emergency room if any of your symptoms worsen or new symptoms occur.    Asthma, Acute Bronchospasm Acute bronchospasm caused by asthma is also referred to as an asthma attack. Bronchospasm means your air passages become narrowed. The narrowing is caused by inflammation and tightening of the muscles in the air tubes (bronchi) in your lungs. This can make it hard to breathe or cause you to wheeze and cough. CAUSES Possible triggers are:  Animal dander from the skin, hair, or feathers of animals.  Dust mites contained in house dust.  Cockroaches.  Pollen from trees or grass.  Mold.  Cigarette or tobacco smoke.  Air pollutants such as dust, household cleaners, hair sprays, aerosol sprays, paint fumes, strong chemicals, or strong odors.  Cold air or weather changes. Cold air may trigger inflammation. Winds increase molds and pollens in the air.  Strong emotions such as crying or laughing hard.  Stress.  Certain medicines such as aspirin or beta-blockers.  Sulfites in foods and drinks, such as dried fruits and wine.  Infections or inflammatory conditions, such as a flu, cold, or inflammation of the nasal membranes (rhinitis).  Gastroesophageal reflux disease (GERD). GERD is a condition where stomach acid backs up into your esophagus.  Exercise or strenuous activity. SIGNS AND SYMPTOMS   Wheezing.  Excessive coughing, particularly at night.  Chest tightness.  Shortness of breath. DIAGNOSIS  Your health care provider will ask you about your medical history and perform a physical exam. A chest X-ray or blood testing may be performed to  look for other causes of your symptoms or other conditions that may have triggered your asthma attack. TREATMENT  Treatment is aimed at reducing inflammation and opening up the airways in your lungs. Most asthma attacks are treated with inhaled medicines. These include quick relief or rescue medicines (such as bronchodilators) and controller medicines (such as inhaled corticosteroids). These medicines are sometimes given through an inhaler or a nebulizer. Systemic steroid medicine taken by mouth or given through an IV tube also can be used to reduce the inflammation when an attack is moderate or severe. Antibiotic medicines are only used if a bacterial infection is present.  HOME CARE INSTRUCTIONS   Rest.  Drink plenty of liquids. This helps the mucus to remain thin and be easily coughed up. Only use caffeine in moderation and do not use alcohol until you have recovered from your illness.  Do not smoke. Avoid being exposed to secondhand smoke.  You play a critical role in keeping yourself in good health. Avoid exposure to things that cause you to wheeze or to have breathing problems.  Keep your medicines up-to-date and available. Carefully follow your health care provider's treatment plan.  Take your medicine exactly as prescribed.  When pollen or pollution is bad, keep windows closed and use an air conditioner or go to places with air conditioning.  Asthma requires careful medical care. See your health care provider for a follow-up as advised. If you are more than [redacted] weeks pregnant and you were prescribed any new medicines, let your obstetrician know about the visit and how you are doing.  Follow up with your health care provider as directed.  After you have recovered from your asthma attack, make an appointment with your outpatient doctor to talk about ways to reduce the likelihood of future attacks. If you do not have a doctor who manages your asthma, make an appointment with a primary care  doctor to discuss your asthma. SEEK IMMEDIATE MEDICAL CARE IF:   You are getting worse.  You have trouble breathing. If severe, call your local emergency services (911 in the [place]  You develop chest pain or discomfort.  You are vomiting.  You are not able to keep fluids down.  You are coughing up yellow, green, brown, or bloody sputum.  You have a fever and your symptoms suddenly get worse.  You have trouble swallowing. MAKE SURE YOU:   Understand these instructions.  Will watch your condition.  Will get help right away if you are not doing well or get worse.   This information is not intended to replace advice given to you by your health care provider. Make sure you discuss any questions you have with your health care provider.   Document Released: 06/06/2006 Document Revised: 02/24/2013 Document Reviewed: 08/27/2012 Elsevier Interactive Patient Education [employer].

## 2015-03-27 NOTE — Progress Notes (Addendum)
Subjective:  This chart was scribed for Merri Ray, MD by [facility], medical scribe at [facility].The patient was seen in exam room 07 and the patient's care was started at 11:20 AM.   Patient ID: Matthew Brooks, male    DOB: [birth year], 40 y.o.   MRN: [id number] Chief Complaint  Patient presents with  . Cough   HPI HPI Comments: Matthew Brooks is a 40 y.o. male with a history of asthma who presents to [facility] complaining of a cough which began two days ago with associated wheezing, SOB and a sore throat for the past two days. Last treated in 2015 or an acute excerebration , previously treated with Symbicort and albuterol. He ran out of his albuterol inhaler two months ago, and was not given the other Symbicort inhaler due to insurance issues. No fever. He did not get the flu shot this year.  There are no active problems to display for this patient.  Past Medical History  Diagnosis Date  . Asthma    No past surgical history on file. Allergies  Allergen Reactions  . Augmentin [Amoxicillin-Pot Clavulanate] Nausea And Vomiting   Prior to Admission medications   Medication Sig Start Date End Date Taking? Authorizing Provider  albuterol (PROVENTIL HFA) 108 (90 BASE) MCG/ACT inhaler Inhale 2 puffs into the lungs every 4 (four) hours as needed. 03/02/14  Yes Roselee Culver, MD  budesonide-formoterol [facility]) 80-4.5 MCG/ACT inhaler Inhale 2 puffs into the lungs 2 (two) times daily. Patient not taking: Reported on 03/27/2015 03/02/14   Roselee Culver, MD   Social History   Social History  . Marital Status: Married    Spouse Name: N/A  . Number of Children: N/A  . Years of Education: N/A   Occupational History  . Not on file.   Social History Main Topics  . Smoking status: Never Smoker   . Smokeless tobacco: Not on file  . Alcohol Use: No  . Drug Use: No  . Sexual Activity: Yes   Other Topics Concern  . Not on file   Social  History Narrative   Review of Systems  HENT: Positive for sore throat.   Respiratory: Positive for cough, shortness of breath and wheezing.       Objective:  BP 128/74 mmHg  Pulse 116  Temp(Src) 98.1 F (36.7 C)  Resp 20  Ht 5' 5.5" (1.664 m)  Wt 172 lb (78.019 kg)  BMI 28.18 kg/m2  SpO2 95% Physical Exam  Constitutional: He is oriented to person, place, and time. He appears well-developed and well-nourished. No distress.  HENT:  Head: Normocephalic and atraumatic.  Eyes: Pupils are equal, round, and reactive to light.  Neck: Normal range of motion.  Cardiovascular: Normal rate and regular rhythm.   Pulmonary/Chest: Effort normal. No respiratory distress.  Diffuse wheezing with coarse breath sound right lower lobe. Albuterol and Atrovent given, still diffuse wheezing and rhonchi in right lower lobe.  Musculoskeletal: Normal range of motion.  Neurological: He is alert and oriented to person, place, and time.  Skin: Skin is warm and dry.  Psychiatric: He has a normal mood and affect. His behavior is normal.  Nursing note and vitals reviewed. [facility] reading (PRIMARY) by Dr. Carlota Raspberry: chest x-ray showed few increased bronchitic markings more on right than left.  Peak flow reading is 500, about 90 % of predicted (550-560).       Assessment & Plan:   Matthew Brooks is  a 40 y.o. male Asthma, mild persistent, with acute exacerbation - Plan: albuterol (PROVENTIL) (2.5 MG/3ML) 0.083% nebulizer solution 2.5 mg, ipratropium (ATROVENT) nebulizer solution 0.5 mg, DG Chest 2 View, albuterol (PROVENTIL) (2.5 MG/3ML) 0.083% nebulizer solution 2.5 mg, azithromycin (ZITHROMAX) 250 MG tablet, predniSONE (DELTASONE) 20 MG tablet, beclomethasone (QVAR) 80 MCG/ACT inhaler, albuterol (PROVENTIL HFA) 108 (90 Base) MCG/ACT inhaler  Cough - Plan: DG Chest 2 View, azithromycin (ZITHROMAX) 250 MG tablet, predniSONE (DELTASONE) 20 MG tablet, beclomethasone (QVAR) 80 MCG/ACT inhaler, albuterol (PROVENTIL HFA) 108  (90 Base) MCG/ACT inhaler  Asthmatic bronchitis, mild persistent, with acute exacerbation - Plan: azithromycin (ZITHROMAX) 250 MG tablet, predniSONE (DELTASONE) 20 MG tablet, beclomethasone (QVAR) 80 MCG/ACT inhaler, albuterol (PROVENTIL HFA) 108 (90 Base) MCG/ACT inhaler  Status post 2 albuterol nebulizers and 1 Atrovent nebulizer treatment. Much improved aeration with few coarse breath sounds right lower lobe, left lower lobe. Suspected asthmatic bronchitis versus asthma and early lower respiratory tract infection/pneumonia. Afebrile, O2 sat stable. Had not been adherent to daily inhaler for asthma recently, so suspect flare off of medications.  -Start Qvar twice a day, albuterol as needed.  -Prednisone 40 mg daily 3 days, side effects discussed.  -Azithromycin for possible secondary infection/ LRTI.  -RTC/ER precautions, and follow-up in 6 weeks for regular asthmatic follow-up to determine if Qvar is sufficient for treatment.   Meds ordered this encounter  Medications  . albuterol (PROVENTIL) (2.5 MG/3ML) 0.083% nebulizer solution 2.5 mg    Sig:   . ipratropium (ATROVENT) nebulizer solution 0.5 mg    Sig:   . albuterol (PROVENTIL) (2.5 MG/3ML) 0.083% nebulizer solution 2.5 mg    Sig:   . azithromycin (ZITHROMAX) 250 MG tablet    Sig: Take 2 pills by mouth on day 1, then 1 pill by mouth per day on days 2 through 5.    Dispense:  6 tablet    Refill:  0  . predniSONE (DELTASONE) 20 MG tablet    Sig: Take 2 tablets (40 mg total) by mouth daily with breakfast.    Dispense:  6 tablet    Refill:  0  . beclomethasone (QVAR) 80 MCG/ACT inhaler    Sig: Inhale 1 puff into the lungs 2 (two) times daily.    Dispense:  1 Inhaler    Refill:  3  . albuterol (PROVENTIL HFA) 108 (90 Base) MCG/ACT inhaler    Sig: Inhale 2 puffs into the lungs every 4 (four) hours as needed.    Dispense:  1 Inhaler    Refill:  1   Patient Instructions  Start new inhaler Qvar twice per day to treat asthma and  albuterol up to every 4 hours, but only if needed. Start azithromycin to treat possible infection, and prednisone 2 pills per day for 3 days for this current asthma flare.  Return for recheck in next 2 months to make sure asthma is controlled.  Return to the clinic or go to the nearest emergency room if any of your symptoms worsen or new symptoms occur.    Asthma, Acute Bronchospasm Acute bronchospasm caused by asthma is also referred to as an asthma attack. Bronchospasm means your air passages become narrowed. The narrowing is caused by inflammation and tightening of the muscles in the air tubes (bronchi) in your lungs. This can make it hard to breathe or cause you to wheeze and cough. CAUSES Possible triggers are:  Animal dander from the skin, hair, or feathers of animals.  Dust mites contained in house dust.  Cockroaches.  Pollen from trees or grass.  Mold.  Cigarette or tobacco smoke.  Air pollutants such as dust, household cleaners, hair sprays, aerosol sprays, paint fumes, strong chemicals, or strong odors.  Cold air or weather changes. Cold air may trigger inflammation. Winds increase molds and pollens in the air.  Strong emotions such as crying or laughing hard.  Stress.  Certain medicines such as aspirin or beta-blockers.  Sulfites in foods and drinks, such as dried fruits and wine.  Infections or inflammatory conditions, such as a flu, cold, or inflammation of the nasal membranes (rhinitis).  Gastroesophageal reflux disease (GERD). GERD is a condition where stomach acid backs up into your esophagus.  Exercise or strenuous activity. SIGNS AND SYMPTOMS   Wheezing.  Excessive coughing, particularly at night.  Chest tightness.  Shortness of breath. DIAGNOSIS  Your health care provider will ask you about your medical history and perform a physical exam. A chest X-ray or blood testing may be performed to look for other causes of your symptoms or other conditions  that may have triggered your asthma attack. TREATMENT  Treatment is aimed at reducing inflammation and opening up the airways in your lungs. Most asthma attacks are treated with inhaled medicines. These include quick relief or rescue medicines (such as bronchodilators) and controller medicines (such as inhaled corticosteroids). These medicines are sometimes given through an inhaler or a nebulizer. Systemic steroid medicine taken by mouth or given through an IV tube also can be used to reduce the inflammation when an attack is moderate or severe. Antibiotic medicines are only used if a bacterial infection is present.  HOME CARE INSTRUCTIONS   Rest.  Drink plenty of liquids. This helps the mucus to remain thin and be easily coughed up. Only use caffeine in moderation and do not use alcohol until you have recovered from your illness.  Do not smoke. Avoid being exposed to secondhand smoke.  You play a critical role in keeping yourself in good health. Avoid exposure to things that cause you to wheeze or to have breathing problems.  Keep your medicines up-to-date and available. Carefully follow your health care provider's treatment plan.  Take your medicine exactly as prescribed.  When pollen or pollution is bad, keep windows closed and use an air conditioner or go to places with air conditioning.  Asthma requires careful medical care. See your health care provider for a follow-up as advised. If you are more than [redacted] weeks pregnant and you were prescribed any new medicines, let your obstetrician know about the visit and how you are doing. Follow up with your health care provider as directed.  After you have recovered from your asthma attack, make an appointment with your outpatient doctor to talk about ways to reduce the likelihood of future attacks. If you do not have a doctor who manages your asthma, make an appointment with a primary care doctor to discuss your asthma. SEEK IMMEDIATE MEDICAL CARE  IF:   You are getting worse.  You have trouble breathing. If severe, call your local emergency services (911 in the [place]  You develop chest pain or discomfort.  You are vomiting.  You are not able to keep fluids down.  You are coughing up yellow, green, brown, or bloody sputum.  You have a fever and your symptoms suddenly get worse.  You have trouble swallowing. MAKE SURE YOU:   Understand these instructions.  Will watch your condition.  Will get help right away if you are not doing well  or get worse.   This information is not intended to replace advice given to you by your health care provider. Make sure you discuss any questions you have with your health care provider.   Document Released: 06/06/2006 Document Revised: 02/24/2013 Document Reviewed: 08/27/2012 Elsevier Interactive Patient Education [employer].    By signing my name below, I, Nadim Abuhashem, attest that this documentation has been prepared under the direction and in the presence of Merri Ray, MD.  Electronically Signed: Lora Havens, medical scribe. 03/27/2015, 12:00 PM.

## 2015-05-31 ENCOUNTER — Ambulatory Visit (INDEPENDENT_AMBULATORY_CARE_PROVIDER_SITE_OTHER): Payer: 59 | Admitting: Emergency Medicine

## 2015-05-31 VITALS — BP 132/84 | HR 90 | Temp 97.7°F | Resp 18 | Ht 65.0 in | Wt 169.8 lb

## 2015-05-31 DIAGNOSIS — L309 Dermatitis, unspecified: Secondary | ICD-10-CM | POA: Diagnosis not present

## 2015-05-31 DIAGNOSIS — J45909 Unspecified asthma, uncomplicated: Secondary | ICD-10-CM | POA: Insufficient documentation

## 2015-05-31 DIAGNOSIS — L509 Urticaria, unspecified: Secondary | ICD-10-CM

## 2015-05-31 DIAGNOSIS — J4520 Mild intermittent asthma, uncomplicated: Secondary | ICD-10-CM

## 2015-05-31 MED ORDER — DIPHENHYDRAMINE HCL 50 MG/ML IJ SOLN
25.0000 mg | Freq: Once | INTRAMUSCULAR | Status: AC
  Administered 2015-05-31: 25 mg via INTRAMUSCULAR

## 2015-05-31 MED ORDER — TRIAMCINOLONE 0.1 % CREAM:EUCERIN CREAM 1:1: 1.0000 "application " | TOPICAL_CREAM | Freq: Two times a day (BID) | CUTANEOUS | Status: DC

## 2015-05-31 NOTE — Progress Notes (Signed)
    Subjective   Matthew Brooks is a 40 y.o. male that presents for an urgent care visit  1. Itchy rash: Symptoms started yesterday. He noticed the rash all over his body. His itching has remained unchanged. He has tried a cream for dry skin which has not helped. No new detergents. He works at the [employer]. 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 No new foods. No fevers, nausea, vomiting, abdominal pain, swallowing issues and no swelling. Rash has improved today.  ROS Per HPI  Social History  Substance Use Topics  . Smoking status: Never Smoker   . Smokeless tobacco: None  . Alcohol Use: No    Allergies  Allergen Reactions  . Augmentin [Amoxicillin-Pot Clavulanate] Nausea And Vomiting    Objective   BP 132/84 mmHg  Pulse 90  Temp(Src) 97.7 F (36.5 C) (Oral)  Resp 18  Ht 5\' 5"  (1.651 m)  Wt 169 lb 12.8 oz (77.021 kg)  BMI 28.26 kg/m2  SpO2 96%  General: Well appearing, no distress. Scratching. HEENT: oropharynx clear and moist. Mucosa not edematous Skin: Rash appears urticarial but improved and is located on back, chest, abdomen and bilateral arms. He has some excoriation on arms, thighs and legs.   Assessment and Plan   1. Urticarial rash Patient's reaction improved today. That in addition to symptoms not suggesting anaphylaxis, will treat with antihistamines only. Recommended benadryl, zantac and zyrtec OTC. Work precautions given. Red flags for immediate return to the emergency department given. - diphenhydrAMINE (BENADRYL) injection 25 mg; Inject 0.5 mLs (25 mg total) into the muscle once.  2. Eczema - Triamcinolone Acetonide (TRIAMCINOLONE 0.1 % CREAM : EUCERIN) CREA; Apply 1 application topically 2 (two) times daily. Apply twice a day to affected area. Mix is 240 g of triamcinolone with 240 g of Eucerin  Dispense: 480 each; Refill: 1

## 2015-05-31 NOTE — Patient Instructions (Addendum)
Thank you for coming to see me today. It was a pleasure. Today we talked about:   Itching: this is likely an allergic reaction. The good news is it is improving well. Please use Benadryl, Zantac and Zyrtec over the counter to help with symptoms. Use as directed. If you have repeat symptoms, especially with associated nausea, vomiting, trouble breathing, please seek immediate medical attention. 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  Sincerely,  Cordelia Poche, MD     IF you received an x-ray today, you will receive an invoice from [facility]. Please contact [facility] at [phone] with questions or concerns regarding your invoice.   IF you received labwork today, you will receive an invoice from [employer]. Please contact [facility] at [phone] with questions or concerns regarding your invoice.   Our billing staff will not be able to assist you with questions regarding bills from these companies.  You will be contacted with the lab results as soon as they are available. The fastest way to get your results is to activate your My Chart account. Instructions are located on the last page of this paperwork. If you have not heard from [place] regarding the results in 2 weeks, please contact this office.

## 2016-06-15 ENCOUNTER — Other Ambulatory Visit: Payer: Self-pay | Admitting: Family Medicine

## 2016-06-15 DIAGNOSIS — J4531 Mild persistent asthma with (acute) exacerbation: Secondary | ICD-10-CM

## 2016-06-15 DIAGNOSIS — R059 Cough, unspecified: Secondary | ICD-10-CM

## 2016-06-15 DIAGNOSIS — R05 Cough: Secondary | ICD-10-CM

## 2016-06-16 ENCOUNTER — Telehealth: Payer: Self-pay | Admitting: Family Medicine

## 2016-06-16 NOTE — Telephone Encounter (Signed)
Albuterol inhaler refilled for 1, but due for follow up to discuss asthma. Please schedule patient for follow up.

## 2016-06-16 NOTE — Telephone Encounter (Signed)
lmom to call and schedule an appointment  With greene for f/u on asthma

## 2016-08-27 ENCOUNTER — Ambulatory Visit (INDEPENDENT_AMBULATORY_CARE_PROVIDER_SITE_OTHER): Payer: Managed Care, Other (non HMO)

## 2016-08-27 ENCOUNTER — Other Ambulatory Visit: Payer: Self-pay | Admitting: Family Medicine

## 2016-08-27 ENCOUNTER — Ambulatory Visit (INDEPENDENT_AMBULATORY_CARE_PROVIDER_SITE_OTHER): Payer: Managed Care, Other (non HMO) | Admitting: Family Medicine

## 2016-08-27 ENCOUNTER — Encounter: Payer: Self-pay | Admitting: Family Medicine

## 2016-08-27 VITALS — BP 123/85 | HR 91 | Temp 98.6°F | Resp 16 | Ht 65.25 in | Wt 169.4 lb

## 2016-08-27 DIAGNOSIS — J4531 Mild persistent asthma with (acute) exacerbation: Secondary | ICD-10-CM

## 2016-08-27 DIAGNOSIS — R05 Cough: Secondary | ICD-10-CM | POA: Diagnosis not present

## 2016-08-27 DIAGNOSIS — R059 Cough, unspecified: Secondary | ICD-10-CM

## 2016-08-27 MED ORDER — BUDESONIDE 90 MCG/ACT IN AEPB: 2.0000 | INHALATION_SPRAY | Freq: Two times a day (BID) | RESPIRATORY_TRACT | 3 refills | Status: DC

## 2016-08-27 MED ORDER — ALBUTEROL SULFATE (2.5 MG/3ML) 0.083% IN NEBU
2.5000 mg | INHALATION_SOLUTION | Freq: Once | RESPIRATORY_TRACT | Status: AC
  Administered 2016-08-27: 2.5 mg via RESPIRATORY_TRACT

## 2016-08-27 MED ORDER — ALBUTEROL SULFATE HFA 108 (90 BASE) MCG/ACT IN AERS: 1.0000 | INHALATION_SPRAY | RESPIRATORY_TRACT | 0 refills | Status: DC | PRN

## 2016-08-27 NOTE — Patient Instructions (Addendum)
Start pulmicort each day for asthma maintenance. Albuterol if needed for breakthrough wheezing, but if you continue to need this every day in next week, then may need other medicine. Please return if not improving.  Sooner or to emergency room if worse.   Return to office in next 3 months to recheck asthma.    Asthma, Adult Asthma is a recurring condition in which the airways tighten and narrow. Asthma can make it difficult to breathe. It can cause coughing, wheezing, and shortness of breath. Asthma episodes, also called asthma attacks, range from minor to life-threatening. Asthma cannot be cured, but medicines and lifestyle changes can help control it. What are the causes? Asthma is believed to be caused by inherited (genetic) and environmental factors, but its exact cause is unknown. Asthma may be triggered by allergens, lung infections, or irritants in the air. Asthma triggers are different for each person. Common triggers include:  Animal dander.  Dust mites.  Cockroaches.  Pollen from trees or grass.  Mold.  Smoke.  Air pollutants such as dust, household cleaners, hair sprays, aerosol sprays, paint fumes, strong chemicals, or strong odors.  Cold air, weather changes, and winds (which increase molds and pollens in the air).  Strong emotional expressions such as crying or laughing hard.  Stress.  Certain medicines (such as aspirin) or types of drugs (such as beta-blockers).  Sulfites in foods and drinks. Foods and drinks that may contain sulfites include dried fruit, potato chips, and sparkling grape juice.  Infections or inflammatory conditions such as the flu, a cold, or an inflammation of the nasal membranes (rhinitis).  Gastroesophageal reflux disease (GERD).  Exercise or strenuous activity.  What are the signs or symptoms? Symptoms may occur immediately after asthma is triggered or many hours later. Symptoms include:  Wheezing.  Excessive nighttime or early  morning coughing.  Frequent or severe coughing with a common cold.  Chest tightness.  Shortness of breath.  How is this diagnosed? The diagnosis of asthma is made by a review of your medical history and a physical exam. Tests may also be performed. These may include:  Lung function studies. These tests show how much air you breathe in and out.  Allergy tests.  Imaging tests such as X-rays.  How is this treated? Asthma cannot be cured, but it can usually be controlled. Treatment involves identifying and avoiding your asthma triggers. It also involves medicines. There are 2 classes of medicine used for asthma treatment:  Controller medicines. These prevent asthma symptoms from occurring. They are usually taken every day.  Reliever or rescue medicines. These quickly relieve asthma symptoms. They are used as needed and provide short-term relief.  Your health care provider will help you create an asthma action plan. An asthma action plan is a written plan for managing and treating your asthma attacks. It includes a list of your asthma triggers and how they may be avoided. It also includes information on when medicines should be taken and when their dosage should be changed. An action plan may also involve the use of a device called a peak flow meter. A peak flow meter measures how well the lungs are working. It helps you monitor your condition. Follow these instructions at home:  Take medicines only as directed by your health care provider. Speak with your health care provider if you have questions about how or when to take the medicines.  Use a peak flow meter as directed by your health care provider. Record and keep track  of readings.  Understand and use the action plan to help minimize or stop an asthma attack without needing to seek medical care.  Control your home environment in the following ways to help prevent asthma attacks: ? Do not smoke. Avoid being exposed to secondhand  smoke. ? Change your heating and air conditioning filter regularly. ? Limit your use of fireplaces and wood stoves. ? Get rid of pests (such as roaches and mice) and their droppings. ? Throw away plants if you see mold on them. ? Clean your floors and dust regularly. Use unscented cleaning products. ? Try to have someone else vacuum for you regularly. Stay out of rooms while they are being vacuumed and for a short while afterward. If you vacuum, use a dust mask from a hardware store, a double-layered or microfilter vacuum cleaner bag, or a vacuum cleaner with a HEPA filter. ? Replace carpet with wood, tile, or vinyl flooring. Carpet can trap dander and dust. ? Use allergy-proof pillows, mattress covers, and box spring covers. ? Wash bed sheets and blankets every week in hot water and dry them in a dryer. ? Use blankets that are made of polyester or cotton. ? Clean bathrooms and kitchens with bleach. If possible, have someone repaint the walls in these rooms with mold-resistant paint. Keep out of the rooms that are being cleaned and painted. ? Wash hands frequently. Contact a health care provider if:  You have wheezing, shortness of breath, or a cough even if taking medicine to prevent attacks.  The colored mucus you cough up (sputum) is thicker than usual.  Your sputum changes from clear or white to yellow, green, gray, or bloody.  You have any problems that may be related to the medicines you are taking (such as a rash, itching, swelling, or trouble breathing).  You are using a reliever medicine more than 2-3 times per week.  Your peak flow is still at 50-79% of your personal best after following your action plan for 1 hour.  You have a fever. Get help right away if:  You seem to be getting worse and are unresponsive to treatment during an asthma attack.  You are short of breath even at rest.  You get short of breath when doing very little physical activity.  You have difficulty  eating, drinking, or talking due to asthma symptoms.  You develop chest pain.  You develop a fast heartbeat.  You have a bluish color to your lips or fingernails.  You are light-headed, dizzy, or faint.  Your peak flow is less than 50% of your personal best. This information is not intended to replace advice given to you by your health care provider. Make sure you discuss any questions you have with your health care provider. Document Released: 02/19/2005 Document Revised: 08/03/2015 Document Reviewed: 09/18/2012 Elsevier Interactive Patient Education  2017 [employer].    IF you received an x-ray today, you will receive an invoice from [facility]. Please contact [facility] at [phone] with questions or concerns regarding your invoice.   IF you received labwork today, you will receive an invoice from [facility]. Please contact [facility] at [phone] with questions or concerns regarding your invoice.   Our billing staff will not be able to assist you with questions regarding bills from these companies.  You will be contacted with the lab results as soon as they are available. The fastest way to get your results is to activate your My Chart account. Instructions are located on the  last page of this paperwork. If you have not heard from [place] regarding the results in 2 weeks, please contact this office.

## 2016-08-27 NOTE — Progress Notes (Addendum)
Subjective:  By signing my name below, I, Moises Blood, attest that this documentation has been prepared under the direction and in the presence of Merri Ray, MD. Electronically Signed: Moises Blood, [place]. 08/27/2016 , 6:33 PM .  Patient was seen in Room 4 .   Patient ID: Matthew Brooks, male    DOB: March 27, 1975, 41 y.o.   MRN: [id number] Chief Complaint  Patient presents with  . Breathing Problem    x last week, pt out medication   HPI Matthew Brooks is a 41 y.o. male Patient has history of asthma. I last saw him in Jan 2017 with mild persistent cough at that time. He had also run out of his medications prior to that visit. Recommended recheck in 2 months, which at that time was seen by Dr. Everlene Farrier on Mar 2017. I agreed to refill his albuterol inhaler in April of this year, but stressed importance of being seen.   He presented to our waiting room with shortness of breath and wheezing. Patient states he's been out of his albuterol inhaler for 3 days now. He had 3 refills of Qvar in Jan 2017. He would use his albuterol when he needs it, sometimes everyday; in the past month, he's been using it daily. He has an occasional cough and would also cough up clear mucus. He denies any fever.   Patient Active Problem List   Diagnosis Date Noted  . Asthma 05/31/2015  . Eczema 05/31/2015   Past Medical History:  Diagnosis Date  . Asthma    History reviewed. No pertinent surgical history. Allergies  Allergen Reactions  . Augmentin [Amoxicillin-Pot Clavulanate] Nausea And Vomiting   Prior to Admission medications   Medication Sig Start Date End Date Taking? Authorizing Provider  azithromycin (ZITHROMAX) 250 MG tablet Take 2 pills by mouth on day 1, then 1 pill by mouth per day on days 2 through 5. 03/27/15  Yes Wendie Agreste, MD  beclomethasone (QVAR) 80 MCG/ACT inhaler Inhale 1 puff into the lungs 2 (two) times daily. 03/27/15  Yes Wendie Agreste, MD  PROAIR HFA 108 [phone] Base) MCG/ACT inhaler  INHALE 2 PUFFS INTO THE LUNGS EVERY 4 (FOUR) HOURS AS NEEDED. 06/16/16  Yes Wendie Agreste, MD  Triamcinolone Acetonide (TRIAMCINOLONE 0.1 % CREAM : EUCERIN) CREA Apply 1 application topically 2 (two) times daily. Apply twice a day to affected area. Mix is 240 g of triamcinolone with 240 g of Eucerin 05/31/15  Yes Daub, Loura Back, MD  predniSONE (DELTASONE) 20 MG tablet Take 2 tablets (40 mg total) by mouth daily with breakfast. Patient not taking: Reported on 08/27/2016 03/27/15   Wendie Agreste, MD   Social History   Social History  . Marital status: Married    Spouse name: N/A  . Number of children: N/A  . Years of education: N/A   Occupational History  . Not on file.   Social History Main Topics  . Smoking status: Never Smoker  . Smokeless tobacco: Never Used  . Alcohol use No  . Drug use: No  . Sexual activity: Yes   Other Topics Concern  . Not on file   Social History Narrative  . No narrative on file   Review of Systems  Constitutional: Negative for fatigue, fever and unexpected weight change.  Eyes: Negative for visual disturbance.  Respiratory: Positive for cough, shortness of breath and wheezing. Negative for chest tightness.   Cardiovascular: Negative for chest pain, palpitations and leg swelling.  Gastrointestinal: Negative  for abdominal pain and blood in stool.  Neurological: Negative for dizziness, light-headedness and headaches.       Objective:   Physical Exam  Constitutional: He is oriented to person, place, and time. He appears well-developed and well-nourished. No distress.  HENT:  Head: Normocephalic and atraumatic.  Eyes: EOM are normal. Pupils are equal, round, and reactive to light.  Neck: Neck supple.  Cardiovascular: Normal rate.   Pulmonary/Chest: Effort normal. No respiratory distress.  Distant breath sounds with some inspiratory and expiratory wheeze  Musculoskeletal: Normal range of motion.  Neurological: He is alert and oriented to  person, place, and time.  Skin: Skin is warm and dry.  Psychiatric: He has a normal mood and affect. His behavior is normal.  Nursing note and vitals reviewed.  [6:25PM] After albuterol neb treatment, he states he's feeling better. There is some improvement but there is still some coarse breath sounds in right lower lobe.   Vitals:   08/27/16 1747  BP: 123/85  Pulse: 91  Resp: 16  Temp: 98.6 F (37 C)  TempSrc: Oral  SpO2: 96%  Weight: 169 lb 6.4 oz (76.8 kg)  Height: 5' 5.25" (1.657 m)      Dg Chest 2 View  Result Date: 08/27/2016 CLINICAL DATA:  Cough and wheezing. EXAM: CHEST  2 VIEW COMPARISON:  03/27/2015 and prior exams FINDINGS: The cardiomediastinal silhouette is unremarkable. Mild peribronchial thickening is unchanged. There is no evidence of focal airspace disease, pulmonary edema, suspicious pulmonary nodule/mass, pleural effusion, or pneumothorax. No acute bony abnormalities are identified. IMPRESSION: No active cardiopulmonary disease. Electronically Signed   By: Margarette Canada M.D.   On: 08/27/2016 18:52    Assessment & Plan:    Matthew Brooks is a 41 y.o. male Mild persistent asthma with acute exacerbation - Plan: albuterol (PROVENTIL) (2.5 MG/3ML) 0.083% nebulizer solution 2.5 mg, DG Chest 2 View  Cough - Plan: DG Chest 2 View, Budesonide (PULMICORT FLEXHALER) 90 MCG/ACT inhaler  Asthmatic bronchitis, mild persistent, with acute exacerbation - Plan: Budesonide (PULMICORT FLEXHALER) 90 MCG/ACT inhaler  Asthma, previously mild persistent, recently uncontrolled off of medication. Stressed importance of routine follow-up and to not allow medication to run out, including inhaled corticosteroid that was prescribed last year. Wheezing on initial evaluation, started on albuterol nebulizer treatment, with improvement of symptoms. Reassuring chest x-ray.  -Start Pulmicort 2 puffs twice a day, coupon given, discussed reasons for maintenance medication and difference between  maintenance medicine and when necessary medications. Understanding expressed.  - Albuterol refilled for when necessary use, RTC precautions if frequent or persistent use.  - Recheck 3 months  Meds ordered this encounter  Medications  . albuterol (PROVENTIL) (2.5 MG/3ML) 0.083% nebulizer solution 2.5 mg  . Budesonide (PULMICORT FLEXHALER) 90 MCG/ACT inhaler    Sig: Inhale 2 puffs into the lungs 2 (two) times daily.    Dispense:  1 Inhaler    Refill:  3  . albuterol (PROVENTIL HFA;VENTOLIN HFA) 108 (90 Base) MCG/ACT inhaler    Sig: Inhale 1-2 puffs into the lungs every 4 (four) hours as needed for wheezing or shortness of breath.    Dispense:  1 Inhaler    Refill:  0   Patient Instructions   Start pulmicort each day for asthma maintenance. Albuterol if needed for breakthrough wheezing, but if you continue to need this every day in next week, then may need other medicine. Please return if not improving.  Sooner or to emergency room if worse.   Return to office  in next 3 months to recheck asthma.    Asthma, Adult Asthma is a recurring condition in which the airways tighten and narrow. Asthma can make it difficult to breathe. It can cause coughing, wheezing, and shortness of breath. Asthma episodes, also called asthma attacks, range from minor to life-threatening. Asthma cannot be cured, but medicines and lifestyle changes can help control it. What are the causes? Asthma is believed to be caused by inherited (genetic) and environmental factors, but its exact cause is unknown. Asthma may be triggered by allergens, lung infections, or irritants in the air. Asthma triggers are different for each person. Common triggers include:  Animal dander.  Dust mites.  Cockroaches.  Pollen from trees or grass.  Mold.  Smoke.  Air pollutants such as dust, household cleaners, hair sprays, aerosol sprays, paint fumes, strong chemicals, or strong odors.  Cold air, weather changes, and winds (which  increase molds and pollens in the air).  Strong emotional expressions such as crying or laughing hard.  Stress.  Certain medicines (such as aspirin) or types of drugs (such as beta-blockers).  Sulfites in foods and drinks. Foods and drinks that may contain sulfites include dried fruit, potato chips, and sparkling grape juice.  Infections or inflammatory conditions such as the flu, a cold, or an inflammation of the nasal membranes (rhinitis).  Gastroesophageal reflux disease (GERD).  Exercise or strenuous activity.  What are the signs or symptoms? Symptoms may occur immediately after asthma is triggered or many hours later. Symptoms include:  Wheezing.  Excessive nighttime or early morning coughing.  Frequent or severe coughing with a common cold.  Chest tightness.  Shortness of breath.  How is this diagnosed? The diagnosis of asthma is made by a review of your medical history and a physical exam. Tests may also be performed. These may include:  Lung function studies. These tests show how much air you breathe in and out.  Allergy tests.  Imaging tests such as X-rays.  How is this treated? Asthma cannot be cured, but it can usually be controlled. Treatment involves identifying and avoiding your asthma triggers. It also involves medicines. There are 2 classes of medicine used for asthma treatment:  Controller medicines. These prevent asthma symptoms from occurring. They are usually taken every day.  Reliever or rescue medicines. These quickly relieve asthma symptoms. They are used as needed and provide short-term relief.  Your health care provider will help you create an asthma action plan. An asthma action plan is a written plan for managing and treating your asthma attacks. It includes a list of your asthma triggers and how they may be avoided. It also includes information on when medicines should be taken and when their dosage should be changed. An action plan may also  involve the use of a device called a peak flow meter. A peak flow meter measures how well the lungs are working. It helps you monitor your condition. Follow these instructions at home:  Take medicines only as directed by your health care provider. Speak with your health care provider if you have questions about how or when to take the medicines.  Use a peak flow meter as directed by your health care provider. Record and keep track of readings.  Understand and use the action plan to help minimize or stop an asthma attack without needing to seek medical care.  Control your home environment in the following ways to help prevent asthma attacks: ? Do not smoke. Avoid being exposed to secondhand smoke. ?  Change your heating and air conditioning filter regularly. ? Limit your use of fireplaces and wood stoves. ? Get rid of pests (such as roaches and mice) and their droppings. ? Throw away plants if you see mold on them. ? Clean your floors and dust regularly. Use unscented cleaning products. ? Try to have someone else vacuum for you regularly. Stay out of rooms while they are being vacuumed and for a short while afterward. If you vacuum, use a dust mask from a hardware store, a double-layered or microfilter vacuum cleaner bag, or a vacuum cleaner with a HEPA filter. ? Replace carpet with wood, tile, or vinyl flooring. Carpet can trap dander and dust. ? Use allergy-proof pillows, mattress covers, and box spring covers. ? Wash bed sheets and blankets every week in hot water and dry them in a dryer. ? Use blankets that are made of polyester or cotton. ? Clean bathrooms and kitchens with bleach. If possible, have someone repaint the walls in these rooms with mold-resistant paint. Keep out of the rooms that are being cleaned and painted. ? Wash hands frequently. Contact a health care provider if:  You have wheezing, shortness of breath, or a cough even if taking medicine to prevent attacks.  The  colored mucus you cough up (sputum) is thicker than usual.  Your sputum changes from clear or white to yellow, green, gray, or bloody.  You have any problems that may be related to the medicines you are taking (such as a rash, itching, swelling, or trouble breathing).  You are using a reliever medicine more than 2-3 times per week.  Your peak flow is still at 50-79% of your personal best after following your action plan for 1 hour.  You have a fever. Get help right away if:  You seem to be getting worse and are unresponsive to treatment during an asthma attack.  You are short of breath even at rest.  You get short of breath when doing very little physical activity.  You have difficulty eating, drinking, or talking due to asthma symptoms.  You develop chest pain.  You develop a fast heartbeat.  You have a bluish color to your lips or fingernails.  You are light-headed, dizzy, or faint.  Your peak flow is less than 50% of your personal best. This information is not intended to replace advice given to you by your health care provider. Make sure you discuss any questions you have with your health care provider. Document Released: 02/19/2005 Document Revised: 08/03/2015 Document Reviewed: 09/18/2012 Elsevier Interactive Patient Education  2017 [employer].    IF you received an x-ray today, you will receive an invoice from [facility]. Please contact [facility] at [phone] with questions or concerns regarding your invoice.   IF you received labwork today, you will receive an invoice from [facility]. Please contact [facility] at [phone] with questions or concerns regarding your invoice.   Our billing staff will not be able to assist you with questions regarding bills from these companies.  You will be contacted with the lab results as soon as they are available. The fastest way to get your results is to activate your My Chart account. Instructions are  located on the last page of this paperwork. If you have not heard from [place] regarding the results in 2 weeks, please contact this office.      I personally performed the services described in this documentation, which was scribed in my presence. The recorded information has been reviewed  and considered for accuracy and completeness, addended by me as needed, and agree with information above.  Signed,   Merri Ray, MD [facility].  08/27/16 7:12 PM

## 2016-11-30 ENCOUNTER — Encounter: Payer: Self-pay | Admitting: Family Medicine

## 2016-11-30 ENCOUNTER — Ambulatory Visit (INDEPENDENT_AMBULATORY_CARE_PROVIDER_SITE_OTHER): Payer: Managed Care, Other (non HMO) | Admitting: Family Medicine

## 2016-11-30 VITALS — BP 112/70 | HR 87 | Temp 97.6°F | Resp 16 | Ht 65.25 in | Wt 173.0 lb

## 2016-11-30 DIAGNOSIS — R059 Cough, unspecified: Secondary | ICD-10-CM

## 2016-11-30 DIAGNOSIS — R05 Cough: Secondary | ICD-10-CM | POA: Diagnosis not present

## 2016-11-30 DIAGNOSIS — J4530 Mild persistent asthma, uncomplicated: Secondary | ICD-10-CM | POA: Diagnosis not present

## 2016-11-30 DIAGNOSIS — Z23 Encounter for immunization: Secondary | ICD-10-CM

## 2016-11-30 MED ORDER — BUDESONIDE-FORMOTEROL FUMARATE 80-4.5 MCG/ACT IN AERO: 2.0000 | INHALATION_SPRAY | Freq: Two times a day (BID) | RESPIRATORY_TRACT | 3 refills | Status: DC

## 2016-11-30 MED ORDER — ALBUTEROL SULFATE HFA 108 (90 BASE) MCG/ACT IN AERS: 1.0000 | INHALATION_SPRAY | RESPIRATORY_TRACT | 1 refills | Status: DC | PRN

## 2016-11-30 NOTE — Patient Instructions (Addendum)
Asthma still not well controlled. STOP the Pulmicort inhaler, START Symbicort 2 puffs twice per day. Use co-pay card to decrease cost. Albuterol if needed up to every 4-6 hours, but should not need that as much once you have started Symbicort. Recheck in 3 months, sooner if worse   Asthma, Adult Asthma is a recurring condition in which the airways tighten and narrow. Asthma can make it difficult to breathe. It can cause coughing, wheezing, and shortness of breath. Asthma episodes, also called asthma attacks, range from minor to life-threatening. Asthma cannot be cured, but medicines and lifestyle changes can help control it. What are the causes? Asthma is believed to be caused by inherited (genetic) and environmental factors, but its exact cause is unknown. Asthma may be triggered by allergens, lung infections, or irritants in the air. Asthma triggers are different for each person. Common triggers include:  Animal dander.  Dust mites.  Cockroaches.  Pollen from trees or grass.  Mold.  Smoke.  Air pollutants such as dust, household cleaners, hair sprays, aerosol sprays, paint fumes, strong chemicals, or strong odors.  Cold air, weather changes, and winds (which increase molds and pollens in the air).  Strong emotional expressions such as crying or laughing hard.  Stress.  Certain medicines (such as aspirin) or types of drugs (such as beta-blockers).  Sulfites in foods and drinks. Foods and drinks that may contain sulfites include dried fruit, potato chips, and sparkling grape juice.  Infections or inflammatory conditions such as the flu, a cold, or an inflammation of the nasal membranes (rhinitis).  Gastroesophageal reflux disease (GERD).  Exercise or strenuous activity.  What are the signs or symptoms? Symptoms may occur immediately after asthma is triggered or many hours later. Symptoms include:  Wheezing.  Excessive nighttime or early morning coughing.  Frequent or  severe coughing with a common cold.  Chest tightness.  Shortness of breath.  How is this diagnosed? The diagnosis of asthma is made by a review of your medical history and a physical exam. Tests may also be performed. These may include:  Lung function studies. These tests show how much air you breathe in and out.  Allergy tests.  Imaging tests such as X-rays.  How is this treated? Asthma cannot be cured, but it can usually be controlled. Treatment involves identifying and avoiding your asthma triggers. It also involves medicines. There are 2 classes of medicine used for asthma treatment:  Controller medicines. These prevent asthma symptoms from occurring. They are usually taken every day.  Reliever or rescue medicines. These quickly relieve asthma symptoms. They are used as needed and provide short-term relief.  Your health care provider will help you create an asthma action plan. An asthma action plan is a written plan for managing and treating your asthma attacks. It includes a list of your asthma triggers and how they may be avoided. It also includes information on when medicines should be taken and when their dosage should be changed. An action plan may also involve the use of a device called a peak flow meter. A peak flow meter measures how well the lungs are working. It helps you monitor your condition. Follow these instructions at home:  Take medicines only as directed by your health care provider. Speak with your health care provider if you have questions about how or when to take the medicines.  Use a peak flow meter as directed by your health care provider. Record and keep track of readings.  Understand and use the  action plan to help minimize or stop an asthma attack without needing to seek medical care.  Control your home environment in the following ways to help prevent asthma attacks: ? Do not smoke. Avoid being exposed to secondhand smoke. ? Change your heating and air  conditioning filter regularly. ? Limit your use of fireplaces and wood stoves. ? Get rid of pests (such as roaches and mice) and their droppings. ? Throw away plants if you see mold on them. ? Clean your floors and dust regularly. Use unscented cleaning products. ? Try to have someone else vacuum for you regularly. Stay out of rooms while they are being vacuumed and for a short while afterward. If you vacuum, use a dust mask from a hardware store, a double-layered or microfilter vacuum cleaner bag, or a vacuum cleaner with a HEPA filter. ? Replace carpet with wood, tile, or vinyl flooring. Carpet can trap dander and dust. ? Use allergy-proof pillows, mattress covers, and box spring covers. ? Wash bed sheets and blankets every week in hot water and dry them in a dryer. ? Use blankets that are made of polyester or cotton. ? Clean bathrooms and kitchens with bleach. If possible, have someone repaint the walls in these rooms with mold-resistant paint. Keep out of the rooms that are being cleaned and painted. ? Wash hands frequently. Contact a health care provider if:  You have wheezing, shortness of breath, or a cough even if taking medicine to prevent attacks.  The colored mucus you cough up (sputum) is thicker than usual.  Your sputum changes from clear or white to yellow, green, gray, or bloody.  You have any problems that may be related to the medicines you are taking (such as a rash, itching, swelling, or trouble breathing).  You are using a reliever medicine more than 2-3 times per week.  Your peak flow is still at 50-79% of your personal best after following your action plan for 1 hour.  You have a fever. Get help right away if:  You seem to be getting worse and are unresponsive to treatment during an asthma attack.  You are short of breath even at rest.  You get short of breath when doing very little physical activity.  You have difficulty eating, drinking, or talking due to  asthma symptoms.  You develop chest pain.  You develop a fast heartbeat.  You have a bluish color to your lips or fingernails.  You are light-headed, dizzy, or faint.  Your peak flow is less than 50% of your personal best. This information is not intended to replace advice given to you by your health care provider. Make sure you discuss any questions you have with your health care provider. Document Released: 02/19/2005 Document Revised: 08/03/2015 Document Reviewed: 09/18/2012 Elsevier Interactive Patient Education  2017 [employer].    IF you received an x-ray today, you will receive an invoice from [facility]. Please contact [facility] at [phone] with questions or concerns regarding your invoice.   IF you received labwork today, you will receive an invoice from [facility]. Please contact [facility] at [phone] with questions or concerns regarding your invoice.   Our billing staff will not be able to assist you with questions regarding bills from these companies.  You will be contacted with the lab results as soon as they are available. The fastest way to get your results is to activate your My Chart account. Instructions are located on the last page of this paperwork. If you  have not heard from [place] regarding the results in 2 weeks, please contact this office.

## 2016-11-30 NOTE — Progress Notes (Signed)
Subjective:  By signing my name below, I, Matthew Brooks, attest that this documentation has been prepared under the direction and in the presence of Matthew Agreste, MD Electronically Signed: Ladene Brooks, ED Scribe 11/30/2016 at 12:21 PM.   Patient ID: Matthew Brooks, male    DOB: 07-08-1975, 41 y.o.   MRN: [id number]  Chief Complaint  Patient presents with  . Medication Refill    ventolin    HPI Matthew Brooks is a 41 y.o. male who presents to Primary Care at [facility] for medication refill and follow-up of asthma. Last seen June 28. Mild persistent asthma with asthmatic bronchitis at that time. Discussed appropriate use of daily versus PRN medication. Started on Pulmicort Flexhaler 2 puffs bid and albuterol as needed.    Pt states that he has been using Pulmincort 2 puffs bid and albuterol 3-4 days/week. He uses 2 puffs of albuterol when he has wheezing while at work when he is Matthew Brooks. Also states that he uses albuterol when he wakes from sleep and especially during the Winter. Pt ran out of Ventolin 2-3 days ago. Denies fever.      Patient Active Problem List   Diagnosis Date Noted  . Asthma 05/31/2015  . Eczema 05/31/2015   Past Medical History:  Diagnosis Date  . Asthma    History reviewed. No pertinent surgical history. Allergies  Allergen Reactions  . Augmentin [Amoxicillin-Pot Clavulanate] Nausea And Vomiting   Prior to Admission medications   Medication Sig Start Date End Date Taking? Authorizing Provider  albuterol (PROVENTIL HFA;VENTOLIN HFA) 108 (90 Base) MCG/ACT inhaler Inhale 1-2 puffs into the lungs every 4 (four) hours as needed for wheezing or shortness of breath. 08/27/16   Matthew Agreste, MD  azithromycin (ZITHROMAX) 250 MG tablet Take 2 pills by mouth on day 1, then 1 pill by mouth per day on days 2 through 5. 03/27/15   Matthew Agreste, MD  Budesonide (PULMICORT FLEXHALER) 90 MCG/ACT inhaler Inhale 2 puffs into the lungs 2 (two) times daily. 08/27/16    Matthew Agreste, MD  predniSONE (DELTASONE) 20 MG tablet Take 2 tablets (40 mg total) by mouth daily with breakfast. Patient not taking: Reported on 08/27/2016 03/27/15   Matthew Agreste, MD  Triamcinolone Acetonide (TRIAMCINOLONE 0.1 % CREAM : EUCERIN) CREA Apply 1 application topically 2 (two) times daily. Apply twice a day to affected area. Mix is 240 g of triamcinolone with 240 g of Eucerin 05/31/15   Daub, Loura Back, MD   Social History   Social History  . Marital status: Married    Spouse name: N/A  . Number of children: N/A  . Years of education: N/A   Occupational History  . Not on file.   Social History Main Topics  . Smoking status: Never Smoker  . Smokeless tobacco: Never Used  . Alcohol use No  . Drug use: No  . Sexual activity: Yes   Other Topics Concern  . Not on file   Social History Narrative  . No narrative on file   Review of Systems  Constitutional: Negative for fever.  Respiratory: Positive for wheezing.       Objective:   Physical Exam  Constitutional: He is oriented to person, place, and time. He appears well-developed and well-nourished.  HENT:  Head: Normocephalic and atraumatic.  Right Ear: Tympanic membrane, external ear and ear canal normal.  Left Ear: Tympanic membrane, external ear and ear canal normal.  Nose: No rhinorrhea.  Mouth/Throat: Oropharynx  is clear and moist and mucous membranes are normal. No oropharyngeal exudate or posterior oropharyngeal erythema.  Eyes: Pupils are equal, round, and reactive to light. Conjunctivae are normal.  Neck: Neck supple.  Cardiovascular: Normal rate, regular rhythm, normal heart sounds and intact distal pulses.   No murmur heard. Pulmonary/Chest: Effort normal. He has wheezes. He has no rhonchi. He has no rales.  Faint expiratory wheeze.  Abdominal: Soft. There is no tenderness.  Lymphadenopathy:    He has no cervical adenopathy.  Neurological: He is alert and oriented to person, place, and time.    Skin: Skin is warm and dry. No rash noted.  Psychiatric: He has a normal mood and affect. His behavior is normal.  Vitals reviewed.  Vitals:   11/30/16 1157  BP: 112/70  Pulse: 87  Resp: 16  Temp: 97.6 F (36.4 C)  SpO2: 97%  Weight: 173 lb (78.5 kg)  Height: 5' 5.25" (1.657 m)      Assessment & Plan:    Matthew Brooks is a 42 y.o. male Mild persistent asthma without complication  Need for prophylactic vaccination and inoculation against influenza - Plan: Flu Vaccine QUAD 6+ mos PF IM (Fluarix Quad PF)  Cough  Still persistent use of albuterol, even with Pulmicort daily. Slight wheeze on exam today, but has been out of his albuterol for a few days. Does admit to some nighttime symptoms.  -Change Pulmicort to Symbicort 2 puffs twice a day, continue albuterol as needed. Recheck in 3 months, sooner if worse. Handout given on asthma. Understanding expressed  Meds ordered this encounter  Medications  . budesonide-formoterol (SYMBICORT) 80-4.5 MCG/ACT inhaler    Sig: Inhale 2 puffs into the lungs 2 (two) times daily.    Dispense:  1 Inhaler    Refill:  3  . albuterol (PROVENTIL HFA;VENTOLIN HFA) 108 (90 Base) MCG/ACT inhaler    Sig: Inhale 1-2 puffs into the lungs every 4 (four) hours as needed for wheezing or shortness of breath.    Dispense:  1 Inhaler    Refill:  1   Patient Instructions    Asthma still not well controlled. STOP the Pulmicort inhaler, START Symbicort 2 puffs twice per day. Use co-pay card to decrease cost. Albuterol if needed up to every 4-6 hours, but should not need that as much once you have started Symbicort. Recheck in 3 months, sooner if worse   Asthma, Adult Asthma is a recurring condition in which the airways tighten and narrow. Asthma can make it difficult to breathe. It can cause coughing, wheezing, and shortness of breath. Asthma episodes, also called asthma attacks, range from minor to life-threatening. Asthma cannot be cured, but medicines and  lifestyle changes can help control it. What are the causes? Asthma is believed to be caused by inherited (genetic) and environmental factors, but its exact cause is unknown. Asthma may be triggered by allergens, lung infections, or irritants in the air. Asthma triggers are different for each person. Common triggers include:  Animal dander.  Dust mites.  Cockroaches.  Pollen from trees or grass.  Mold.  Smoke.  Air pollutants such as dust, household cleaners, hair sprays, aerosol sprays, paint fumes, strong chemicals, or strong odors.  Cold air, weather changes, and winds (which increase molds and pollens in the air).  Strong emotional expressions such as crying or laughing hard.  Stress.  Certain medicines (such as aspirin) or types of drugs (such as beta-blockers).  Sulfites in foods and drinks. Foods and drinks that may  contain sulfites include dried fruit, potato chips, and sparkling grape juice.  Infections or inflammatory conditions such as the flu, a cold, or an inflammation of the nasal membranes (rhinitis).  Gastroesophageal reflux disease (GERD).  Exercise or strenuous activity.  What are the signs or symptoms? Symptoms may occur immediately after asthma is triggered or many hours later. Symptoms include:  Wheezing.  Excessive nighttime or early morning coughing.  Frequent or severe coughing with a common cold.  Chest tightness.  Shortness of breath.  How is this diagnosed? The diagnosis of asthma is made by a review of your medical history and a physical exam. Tests may also be performed. These may include:  Lung function studies. These tests show how much air you breathe in and out.  Allergy tests.  Imaging tests such as X-rays.  How is this treated? Asthma cannot be cured, but it can usually be controlled. Treatment involves identifying and avoiding your asthma triggers. It also involves medicines. There are 2 classes of medicine used for asthma  treatment:  Controller medicines. These prevent asthma symptoms from occurring. They are usually taken every day.  Reliever or rescue medicines. These quickly relieve asthma symptoms. They are used as needed and provide short-term relief.  Your health care provider will help you create an asthma action plan. An asthma action plan is a written plan for managing and treating your asthma attacks. It includes a list of your asthma triggers and how they may be avoided. It also includes information on when medicines should be taken and when their dosage should be changed. An action plan may also involve the use of a device called a peak flow meter. A peak flow meter measures how well the lungs are working. It helps you monitor your condition. Follow these instructions at home:  Take medicines only as directed by your health care provider. Speak with your health care provider if you have questions about how or when to take the medicines.  Use a peak flow meter as directed by your health care provider. Record and keep track of readings.  Understand and use the action plan to help minimize or stop an asthma attack without needing to seek medical care.  Control your home environment in the following ways to help prevent asthma attacks: ? Do not smoke. Avoid being exposed to secondhand smoke. ? Change your heating and air conditioning filter regularly. ? Limit your use of fireplaces and wood stoves. ? Get rid of pests (such as roaches and mice) and their droppings. ? Throw away plants if you see mold on them. ? Clean your floors and dust regularly. Use unscented cleaning products. ? Try to have someone else vacuum for you regularly. Stay out of rooms while they are being vacuumed and for a short while afterward. If you vacuum, use a dust mask from a hardware store, a double-layered or microfilter vacuum cleaner bag, or a vacuum cleaner with a HEPA filter. ? Replace carpet with wood, tile, or vinyl  flooring. Carpet can trap dander and dust. ? Use allergy-proof pillows, mattress covers, and box spring covers. ? Wash bed sheets and blankets every week in hot water and dry them in a dryer. ? Use blankets that are made of polyester or cotton. ? Clean bathrooms and kitchens with bleach. If possible, have someone repaint the walls in these rooms with mold-resistant paint. Keep out of the rooms that are being cleaned and painted. ? Wash hands frequently. Contact a health care provider if:  You have wheezing, shortness of breath, or a cough even if taking medicine to prevent attacks.  The colored mucus you cough up (sputum) is thicker than usual.  Your sputum changes from clear or white to yellow, green, gray, or bloody.  You have any problems that may be related to the medicines you are taking (such as a rash, itching, swelling, or trouble breathing).  You are using a reliever medicine more than 2-3 times per week.  Your peak flow is still at 50-79% of your personal best after following your action plan for 1 hour.  You have a fever. Get help right away if:  You seem to be getting worse and are unresponsive to treatment during an asthma attack.  You are short of breath even at rest.  You get short of breath when doing very little physical activity.  You have difficulty eating, drinking, or talking due to asthma symptoms.  You develop chest pain.  You develop a fast heartbeat.  You have a bluish color to your lips or fingernails.  You are light-headed, dizzy, or faint.  Your peak flow is less than 50% of your personal best. This information is not intended to replace advice given to you by your health care provider. Make sure you discuss any questions you have with your health care provider. Document Released: 02/19/2005 Document Revised: 08/03/2015 Document Reviewed: 09/18/2012 Elsevier Interactive Patient Education  2017 [employer].    IF you received an x-ray today,  you will receive an invoice from [facility]. Please contact [facility] at [phone] with questions or concerns regarding your invoice.   IF you received labwork today, you will receive an invoice from [place]. Please contact [facility] at [phone] with questions or concerns regarding your invoice.   Our billing staff will not be able to assist you with questions regarding bills from these companies.  You will be contacted with the lab results as soon as they are available. The fastest way to get your results is to activate your My Chart account. Instructions are located on the last page of this paperwork. If you have not heard from [place] regarding the results in 2 weeks, please contact this office.       I personally performed the services described in this documentation, which was scribed in my presence. The recorded information has been reviewed and considered for accuracy and completeness, addended by me as needed, and agree with information above.  Signed,   Merri Ray, MD Primary [facility] at [facility].  11/30/16 12:37 PM

## 2017-03-09 ENCOUNTER — Ambulatory Visit: Payer: Managed Care, Other (non HMO) | Admitting: Family Medicine

## 2017-03-09 ENCOUNTER — Other Ambulatory Visit: Payer: Self-pay

## 2017-03-09 ENCOUNTER — Encounter: Payer: Self-pay | Admitting: Family Medicine

## 2017-03-09 VITALS — BP 128/72 | HR 97 | Temp 97.9°F | Resp 16 | Ht 65.35 in | Wt 177.0 lb

## 2017-03-09 DIAGNOSIS — J45909 Unspecified asthma, uncomplicated: Secondary | ICD-10-CM

## 2017-03-09 MED ORDER — ALBUTEROL SULFATE HFA 108 (90 BASE) MCG/ACT IN AERS
1.0000 | INHALATION_SPRAY | RESPIRATORY_TRACT | 1 refills | Status: DC | PRN
Start: 2017-03-09 — End: 2017-12-20

## 2017-03-09 MED ORDER — BUDESONIDE-FORMOTEROL FUMARATE 80-4.5 MCG/ACT IN AERO: 2.0000 | INHALATION_SPRAY | Freq: Two times a day (BID) | RESPIRATORY_TRACT | 6 refills | Status: DC

## 2017-03-09 NOTE — Progress Notes (Signed)
Subjective:  By signing my name below, I, Moises Blood, attest that this documentation has been prepared under the direction and in the presence of Merri Ray, MD. Electronically Signed: Moises Blood, [place]. 03/09/2017 , 11:55 AM .  Patient was seen in Room 12 .   Patient ID: Matthew Brooks, male    DOB: 02-20-1976, 42 y.o.   MRN: [id number] Chief Complaint  Patient presents with  . Asthma    follow-up   HPI Matthew Brooks is a 42 y.o. male Here for follow up of asthma. See previous notes. We had discussed PRN dosing medication versus daily treatments. He had been using Pulmicort 2 puffs BID, but still requiring albuterol frequently. He was changed to Symbicort 80-4.5 2 puffs BID and albuterol PRN.   He states he's been using Symbicort 2 puffs BID, and reports works much better than Pulmicort. He denies any problems with medication; denies soreness or thrush in mouth. He's been using albuterol only as needed, less than once every 2 weeks. Although, he informs needing to use albuterol inhaler up to 3 times in a week when he had a cold.   Patient Active Problem List   Diagnosis Date Noted  . Asthma 05/31/2015  . Eczema 05/31/2015   Past Medical History:  Diagnosis Date  . Asthma    History reviewed. No pertinent surgical history. Allergies  Allergen Reactions  . Augmentin [Amoxicillin-Pot Clavulanate] Nausea And Vomiting   Prior to Admission medications   Medication Sig Start Date End Date Taking? Authorizing Provider  albuterol (PROVENTIL HFA;VENTOLIN HFA) 108 (90 Base) MCG/ACT inhaler Inhale 1-2 puffs into the lungs every 4 (four) hours as needed for wheezing or shortness of breath. 11/30/16  Yes Wendie Agreste, MD  budesonide-formoterol [facility]) 80-4.5 MCG/ACT inhaler Inhale 2 puffs into the lungs 2 (two) times daily. 11/30/16  Yes Wendie Agreste, MD  Triamcinolone Acetonide (TRIAMCINOLONE 0.1 % CREAM : EUCERIN) CREA Apply 1 application topically 2 (two) times daily. Apply  twice a day to affected area. Mix is 240 g of triamcinolone with 240 g of Eucerin 05/31/15  Yes Daub, Loura Back, MD   Social History   Socioeconomic History  . Marital status: Married    Spouse name: Not on file  . Number of children: Not on file  . Years of education: Not on file  . Highest education level: Not on file  Social Needs  . Financial resource strain: Not on file  . Food insecurity - worry: Not on file  . Food insecurity - inability: Not on file  . Transportation needs - medical: Not on file  . Transportation needs - non-medical: Not on file  Occupational History  . Not on file  Tobacco Use  . Smoking status: Never Smoker  . Smokeless tobacco: Never Used  Substance and Sexual Activity  . Alcohol use: No  . Drug use: No  . Sexual activity: Yes  Other Topics Concern  . Not on file  Social History Narrative  . Not on file   Review of Systems  Constitutional: Negative for fatigue and unexpected weight change.  Eyes: Negative for visual disturbance.  Respiratory: Negative for cough, chest tightness, shortness of breath and wheezing.   Cardiovascular: Negative for chest pain, palpitations and leg swelling.  Gastrointestinal: Negative for abdominal pain and blood in stool.  Neurological: Negative for dizziness, light-headedness and headaches.       Objective:   Physical Exam  Constitutional: He is oriented to person, place, and time. He  appears well-developed and well-nourished. No distress.  HENT:  Head: Normocephalic and atraumatic.  Eyes: EOM are normal. Pupils are equal, round, and reactive to light.  Neck: Neck supple.  Cardiovascular: Normal rate, regular rhythm and normal heart sounds. Exam reveals no gallop and no friction rub.  No murmur heard. Pulmonary/Chest: Effort normal and breath sounds normal. No respiratory distress. He has no wheezes.  Musculoskeletal: Normal range of motion.  Neurological: He is alert and oriented to person, place, and time.    Skin: Skin is warm and dry.  Psychiatric: He has a normal mood and affect. His behavior is normal.  Nursing note and vitals reviewed.   Vitals:   03/09/17 1104  BP: 128/72  Pulse: 97  Resp: 16  Temp: 97.9 F (36.6 C)  TempSrc: Oral  SpO2: 98%  Weight: 177 lb (80.3 kg)  Height: 5' 5.35" (1.66 m)      Assessment & Plan:    Matthew Brooks is a 42 y.o. male Persistent asthma without complication, unspecified asthma severity  - improved control. Continue symbicort same dose, albuterol if needed for breakthrough sx's, and RTC precautions if increased need or worsening sx's. understanding expressed.   Meds ordered this encounter  Medications  . budesonide-formoterol (SYMBICORT) 80-4.5 MCG/ACT inhaler    Sig: Inhale 2 puffs into the lungs 2 (two) times daily.    Dispense:  1 Inhaler    Refill:  6  . albuterol (PROVENTIL HFA;VENTOLIN HFA) 108 (90 Base) MCG/ACT inhaler    Sig: Inhale 1-2 puffs into the lungs every 4 (four) hours as needed for wheezing or shortness of breath.    Dispense:  1 Inhaler    Refill:  1   Patient Instructions   Asthma is controlled better. Continue symbicort 2 puffs twice per day and albuterol only if needed.  Return to the clinic or go to the nearest emergency room if any of your symptoms worsen or new symptoms occur.   Asthma, Adult Asthma is a condition of the lungs in which the airways tighten and narrow. Asthma can make it hard to breathe. Asthma cannot be cured, but medicine and lifestyle changes can help control it. Asthma may be started (triggered) by:  Animal skin flakes (dander).  Dust.  Cockroaches.  Pollen.  Mold.  Smoke.  Cleaning products.  Hair sprays or aerosol sprays.  Paint fumes or strong smells.  Cold air, weather changes, and winds.  Crying or laughing hard.  Stress.  Certain medicines or drugs.  Foods, such as dried fruit, potato chips, and sparkling grape juice.  Infections or conditions (colds,  flu).  Exercise.  Certain medical conditions or diseases.  Exercise or tiring activities.  Follow these instructions at home:  Take medicine as told by your doctor.  Use a peak flow meter as told by your doctor. A peak flow meter is a tool that measures how well the lungs are working.  Record and keep track of the peak flow meter's readings.  Understand and use the asthma action plan. An asthma action plan is a written plan for taking care of your asthma and treating your attacks.  To help prevent asthma attacks: ? Do not smoke. Stay away from secondhand smoke. ? Change your heating and air conditioning filter often. ? Limit your use of fireplaces and wood stoves. ? Get rid of pests (such as roaches and mice) and their droppings. ? Throw away plants if you see mold on them. ? Clean your floors. Dust regularly. Use cleaning  products that do not smell. ? Have someone vacuum when you are not home. Use a vacuum cleaner with a HEPA filter if possible. ? Replace carpet with wood, tile, or vinyl flooring. Carpet can trap animal skin flakes and dust. ? Use allergy-proof pillows, mattress covers, and box spring covers. ? Wash bed sheets and blankets every week in hot water and dry them in a dryer. ? Use blankets that are made of polyester or cotton. ? Clean bathrooms and kitchens with bleach. If possible, have someone repaint the walls in these rooms with mold-resistant paint. Keep out of the rooms that are being cleaned and painted. ? Wash hands often. Contact a doctor if:  You have make a whistling sound when breaking (wheeze), have shortness of breath, or have a cough even if taking medicine to prevent attacks.  The colored mucus you cough up (sputum) is thicker than usual.  The colored mucus you cough up changes from clear or white to yellow, green, gray, or bloody.  You have problems from the medicine you are taking such as: ? A rash. ? Itching. ? Swelling. ? Trouble  breathing.  You need reliever medicines more than 2-3 times a week.  Your peak flow measurement is still at 50-79% of your personal best after following the action plan for 1 hour.  You have a fever. Get help right away if:  You seem to be worse and are not responding to medicine during an asthma attack.  You are short of breath even at rest.  You get short of breath when doing very little activity.  You have trouble eating, drinking, or talking.  You have chest pain.  You have a fast heartbeat.  Your lips or fingernails start to turn blue.  You are light-headed, dizzy, or faint.  Your peak flow is less than 50% of your personal best. This information is not intended to replace advice given to you by your health care provider. Make sure you discuss any questions you have with your health care provider. Document Released: 08/08/2007 Document Revised: 07/28/2015 Document Reviewed: 09/18/2012 Elsevier Interactive Patient Education  2017 [employer].    IF you received an x-ray today, you will receive an invoice from [facility]. Please contact [facility] at [phone] with questions or concerns regarding your invoice.   IF you received labwork today, you will receive an invoice from [place]. Please contact [facility] at [phone] with questions or concerns regarding your invoice.   Our billing staff will not be able to assist you with questions regarding bills from these companies.  You will be contacted with the lab results as soon as they are available. The fastest way to get your results is to activate your My Chart account. Instructions are located on the last page of this paperwork. If you have not heard from [place] regarding the results in 2 weeks, please contact this office.       I personally performed the services described in this documentation, which was scribed in my presence. The recorded information has been reviewed and considered for  accuracy and completeness, addended by me as needed, and agree with information above.  Signed,   Merri Ray, MD Primary [facility] at [place].  03/09/17 11:57 AM

## 2017-03-09 NOTE — Patient Instructions (Addendum)
Asthma is controlled better. Continue symbicort 2 puffs twice per day and albuterol only if needed.  Return to the clinic or go to the nearest emergency room if any of your symptoms worsen or new symptoms occur.   Asthma, Adult Asthma is a condition of the lungs in which the airways tighten and narrow. Asthma can make it hard to breathe. Asthma cannot be cured, but medicine and lifestyle changes can help control it. Asthma may be started (triggered) by:  Animal skin flakes (dander).  Dust.  Cockroaches.  Pollen.  Mold.  Smoke.  Cleaning products.  Hair sprays or aerosol sprays.  Paint fumes or strong smells.  Cold air, weather changes, and winds.  Crying or laughing hard.  Stress.  Certain medicines or drugs.  Foods, such as dried fruit, potato chips, and sparkling grape juice.  Infections or conditions (colds, flu).  Exercise.  Certain medical conditions or diseases.  Exercise or tiring activities.  Follow these instructions at home:  Take medicine as told by your doctor.  Use a peak flow meter as told by your doctor. A peak flow meter is a tool that measures how well the lungs are working.  Record and keep track of the peak flow meter's readings.  Understand and use the asthma action plan. An asthma action plan is a written plan for taking care of your asthma and treating your attacks.  To help prevent asthma attacks: ? Do not smoke. Stay away from secondhand smoke. ? Change your heating and air conditioning filter often. ? Limit your use of fireplaces and wood stoves. ? Get rid of pests (such as roaches and mice) and their droppings. ? Throw away plants if you see mold on them. ? Clean your floors. Dust regularly. Use cleaning products that do not smell. ? Have someone vacuum when you are not home. Use a vacuum cleaner with a HEPA filter if possible. ? Replace carpet with wood, tile, or vinyl flooring. Carpet can trap animal skin flakes and dust. ? Use  allergy-proof pillows, mattress covers, and box spring covers. ? Wash bed sheets and blankets every week in hot water and dry them in a dryer. ? Use blankets that are made of polyester or cotton. ? Clean bathrooms and kitchens with bleach. If possible, have someone repaint the walls in these rooms with mold-resistant paint. Keep out of the rooms that are being cleaned and painted. ? Wash hands often. Contact a doctor if:  You have make a whistling sound when breaking (wheeze), have shortness of breath, or have a cough even if taking medicine to prevent attacks.  The colored mucus you cough up (sputum) is thicker than usual.  The colored mucus you cough up changes from clear or white to yellow, green, gray, or bloody.  You have problems from the medicine you are taking such as: ? A rash. ? Itching. ? Swelling. ? Trouble breathing.  You need reliever medicines more than 2-3 times a week.  Your peak flow measurement is still at 50-79% of your personal best after following the action plan for 1 hour.  You have a fever. Get help right away if:  You seem to be worse and are not responding to medicine during an asthma attack.  You are short of breath even at rest.  You get short of breath when doing very little activity.  You have trouble eating, drinking, or talking.  You have chest pain.  You have a fast heartbeat.  Your lips or fingernails  start to turn blue.  You are light-headed, dizzy, or faint.  Your peak flow is less than 50% of your personal best. This information is not intended to replace advice given to you by your health care provider. Make sure you discuss any questions you have with your health care provider. Document Released: 08/08/2007 Document Revised: 07/28/2015 Document Reviewed: 09/18/2012 Elsevier Interactive Patient Education  2017 [employer].    IF you received an x-ray today, you will receive an invoice from [facility]. Please contact  [facility] at [phone] with questions or concerns regarding your invoice.   IF you received labwork today, you will receive an invoice from [place]. Please contact [facility] at [phone] with questions or concerns regarding your invoice.   Our billing staff will not be able to assist you with questions regarding bills from these companies.  You will be contacted with the lab results as soon as they are available. The fastest way to get your results is to activate your My Chart account. Instructions are located on the last page of this paperwork. If you have not heard from [place] regarding the results in 2 weeks, please contact this office.

## 2017-07-30 ENCOUNTER — Telehealth: Payer: Self-pay | Admitting: Family Medicine

## 2017-07-30 NOTE — Telephone Encounter (Signed)
Pt would like a call concerning which shots he may need to travel outside of the country. CB [phone]

## 2017-08-02 NOTE — Telephone Encounter (Signed)
Please call patient and have him come in for travel visit to see provider.

## 2017-08-16 ENCOUNTER — Other Ambulatory Visit: Payer: Self-pay

## 2017-08-16 ENCOUNTER — Ambulatory Visit (INDEPENDENT_AMBULATORY_CARE_PROVIDER_SITE_OTHER): Payer: 59 | Admitting: Emergency Medicine

## 2017-08-16 ENCOUNTER — Encounter: Payer: Self-pay | Admitting: Emergency Medicine

## 2017-08-16 VITALS — BP 116/70 | HR 81 | Temp 98.0°F | Resp 16 | Ht 65.55 in | Wt 170.0 lb

## 2017-08-16 DIAGNOSIS — Z23 Encounter for immunization: Secondary | ICD-10-CM | POA: Diagnosis not present

## 2017-08-16 DIAGNOSIS — Z298 Encounter for other specified prophylactic measures: Secondary | ICD-10-CM | POA: Diagnosis not present

## 2017-08-16 MED ORDER — TYPHOID VACCINE PO CPDR: 1.0000 | DELAYED_RELEASE_CAPSULE | ORAL | 0 refills | Status: DC

## 2017-08-16 MED ORDER — DOXYCYCLINE HYCLATE 100 MG PO TABS: 100.0000 mg | ORAL_TABLET | Freq: Every day | ORAL | 0 refills | Status: DC

## 2017-08-16 NOTE — Patient Instructions (Addendum)
   IF you received an x-ray today, you will receive an invoice from [facility]. Please contact [facility] at [phone] with questions or concerns regarding your invoice.   IF you received labwork today, you will receive an invoice from [facility]. Please contact [facility] at [phone] with questions or concerns regarding your invoice.   Our billing staff will not be able to assist you with questions regarding bills from these companies.  You will be contacted with the lab results as soon as they are available. The fastest way to get your results is to activate your My Chart account. Instructions are located on the last page of this paperwork. If you have not heard from us regarding the results in 2 weeks, please contact this office.     TravelVaccine Information Vaccines, also called immunizations, can protect you from certain diseases. Vaccines can also prevent the spread of certain infections. It is important to see your health care provider or a travel medicine specialist 4-6 weeks before you travel. This allows time for recommended vaccines to take effect. It also provides enough time for you to get vaccines that must be given in a series over a period of days or weeks. Vaccines for travelers include:  Routine vaccines. These vaccines are standard.  Recommended travel vaccines. These vaccines are generally recommended before international travel.  Geographically required travel vaccines. These vaccines are necessary before travel to some countries or regions.  If it is less than 4 weeks before you leave, you should still see your health care provider. You might still benefit from vaccines or medicines. What are routine vaccines? Routine vaccines are shots that can protect you from common diseases in many parts of the world. Most routine vaccines are given at certain ages starting in childhood. Routine vaccines also include the annual flu (influenza) vaccine. It  is important that you are up to date on your routine vaccines before you travel. You may be advised to get extra doses, also called booster vaccines, such as the Tdap (tetanus, diphtheria, and pertussis). What are recommended vaccines? Recommended travel vaccines change over time. Your health care provider can tell you what vaccines are recommended before your trip. The most common recommended vaccines before travel are hepatitis A and typhoid vaccines. Know your travel schedule when you visit your health care provider. The vaccines that are recommended before foreign travel will depend on several factors, such as:  The country or countries of travel.  Whether you will be traveling to rural areas.  How long you will be traveling.  The season of the year.  Your age. Older adults should get a vaccine against a certain type of pneumonia (pneumococcal) and a vaccine against shingles (herpes zoster).  Your health status.  Your previous vaccines.  The annual influenza vaccine sometimes differs for the northern and southern hemispheres. You should:  Get both vaccines if you are traveling to the other hemisphere, and you have a chronic medical condition.  Get the vaccine shortly before or during the flu season, and only if the vaccine in your country differs from the vaccine in your destination country.  Get the other influenza vaccine either before leaving the country or shortly after arriving at the destination country.  What are geographically required vaccines? Children should be up to date with all of the recommended vaccinations. Parents should follow the standard vaccination guidelines that are recommended by the pediatrician. Some vaccines may be required during an ongoing outbreak of an infectious disease in   a country or region. Your health care provider will be able to tell you about any outbreaks and required vaccines. Some examples of required vaccines include: Yellow fever  vaccine  Proof of yellow fever immunization is currently required for most people before traveling to certain countries in [place] and [place].  If proof of immunization is incomplete or inaccurate, you could be quarantined, denied entry, or given another dose of vaccine at the travel site.  This vaccine can only be obtained at approved centers.  You should get the yellow fever vaccine at least 10 days before your trip.  After 10 days, most people show immunity to yellow fever.  If it has been longer than 10 years since you received the yellow fever vaccine, another dose is required.  Meningococcal vaccine  Meningococcal immunization may be required prior to travel to parts of [place] and [place].  Proof of meningococcal immunization is required by the [place] [employer] for any person older than age 2 who is taking part in hajj or umrah.  Visas for traveling to take part in hajj or umrah will not even be issued until there is proof of immunization. You should get this vaccine at least 10 days before your trip.  After 10 days, most people show immunity.  If it has been longer than 3 years since your last immunization, another dose is required.  Some travel circumstances may require additional vaccination with the following vaccines:  Hepatitis B.  Rabies.  Tick-borne encephalitis.  Malaria.  Where to find more information:  Centers for Disease Control and Prevention (CDC): www.cdc.gov  World Health Organization (WHO): www.who.int This information is not intended to replace advice given to you by your health care provider. Make sure you discuss any questions you have with your health care provider. Document Released: 02/07/2009 Document Revised: 11/15/2015 Document Reviewed: 07/26/2015 Elsevier Interactive Patient Education  2018 Elsevier Inc.  

## 2017-08-16 NOTE — Progress Notes (Signed)
Matthew Brooks 42 y.o.   Chief Complaint  Patient presents with  . Immunizations    pt states she will be going out of the country and would like to know if he needs immunizations     HISTORY OF PRESENT ILLNESS: This is a 41 y.o. male will be traveling to [place] soon.  Needs immunizations.  HPI   Prior to Admission medications   Medication Sig Start Date End Date Taking? Authorizing Provider  albuterol (PROVENTIL HFA;VENTOLIN HFA) 108 (90 Base) MCG/ACT inhaler Inhale 1-2 puffs into the lungs every 4 (four) hours as needed for wheezing or shortness of breath. 03/09/17  Yes Wendie Agreste, MD  budesonide-formoterol [facility]) 80-4.5 MCG/ACT inhaler Inhale 2 puffs into the lungs 2 (two) times daily. 03/09/17  Yes Wendie Agreste, MD  Triamcinolone Acetonide (TRIAMCINOLONE 0.1 % CREAM : EUCERIN) CREA Apply 1 application topically 2 (two) times daily. Apply twice a day to affected area. Mix is 240 g of triamcinolone with 240 g of Eucerin 05/31/15  Yes Daub, Loura Back, MD    Allergies  Allergen Reactions  . Augmentin [Amoxicillin-Pot Clavulanate] Nausea And Vomiting    Patient Active Problem List   Diagnosis Date Noted  . Asthma 05/31/2015  . Eczema 05/31/2015    Past Medical History:  Diagnosis Date  . Asthma     History reviewed. No pertinent surgical history.  Social History   Socioeconomic History  . Marital status: Married    Spouse name: Not on file  . Number of children: Not on file  . Years of education: Not on file  . Highest education level: Not on file  Occupational History  . Not on file  Social Needs  . Financial resource strain: Not on file  . Food insecurity:    Worry: Not on file    Inability: Not on file  . Transportation needs:    Medical: Not on file    Non-medical: Not on file  Tobacco Use  . Smoking status: Never Smoker  . Smokeless tobacco: Never Used  Substance and Sexual Activity  . Alcohol use: No  . Drug use: No  . Sexual activity: Yes   Lifestyle  . Physical activity:    Days per week: Not on file    Minutes per session: Not on file  . Stress: Not on file  Relationships  . Social connections:    Talks on phone: Not on file    Gets together: Not on file    Attends religious service: Not on file    Active member of club or organization: Not on file    Attends meetings of clubs or organizations: Not on file    Relationship status: Not on file  . Intimate partner violence:    Fear of current or ex partner: Not on file    Emotionally abused: Not on file    Physically abused: Not on file    Forced sexual activity: Not on file  Other Topics Concern  . Not on file  Social History Narrative  . Not on file    History reviewed. No pertinent family history.   Review of Systems  Constitutional: Negative.  Negative for chills, fever and weight loss.  HENT: Negative.  Negative for congestion, hearing loss, nosebleeds and sore throat.   Eyes: Negative for discharge and redness.  Respiratory: Negative.  Negative for cough and shortness of breath.   Cardiovascular: Negative.   Gastrointestinal: Negative.  Negative for abdominal pain, diarrhea, nausea and vomiting.  Genitourinary: Negative.  Negative for dysuria and hematuria.  Skin: Negative.  Negative for rash.  Neurological: Negative.  Negative for dizziness and headaches.  Endo/Heme/Allergies: Negative.   All other systems reviewed and are negative.  Vitals:   08/16/17 1614  BP: 116/70  Pulse: 81  Resp: 16  Temp: 98 F (36.7 C)  SpO2: 99%     Physical Exam  Constitutional: He is oriented to person, place, and time. He appears well-developed and well-nourished.  HENT:  Head: Normocephalic and atraumatic.  Right Ear: External ear normal.  Left Ear: External ear normal.  Nose: Nose normal.  Mouth/Throat: Oropharynx is clear and moist.  Eyes: Pupils are equal, round, and reactive to light. Conjunctivae and EOM are normal.  Neck: Normal range of motion.  Neck supple.  Cardiovascular: Normal rate, regular rhythm and normal heart sounds.  Pulmonary/Chest: Effort normal and breath sounds normal.  Abdominal: Soft. There is no tenderness.  Musculoskeletal: Normal range of motion. He exhibits no edema.  Neurological: He is alert and oriented to person, place, and time. No sensory deficit. He exhibits normal muscle tone.  Skin: Skin is warm and dry. Capillary refill takes less than 2 seconds.  Psychiatric: He has a normal mood and affect. His behavior is normal.  Vitals reviewed.    ASSESSMENT & PLAN: Matthew Brooks was seen today for immunizations.  Diagnoses and all orders for this visit:  Need for viral immunization -     Hepatitis A vaccine adult IM -     Hepatitis B vaccine adult IM -     MMR vaccine subcutaneous  Need for malaria prophylaxis -     doxycycline (VIBRA-TABS) 100 MG tablet; Take 1 tablet (100 mg total) by mouth daily. Start 2 days before travelling, daily while abroad, and then daily x 4 weeks after returning.  Requires typhoid vaccination -     typhoid (VIVOTIF) DR capsule; Take 1 capsule by mouth every other day.   Patient Instructions       IF you received an x-ray today, you will receive an invoice from [facility]. Please contact [facility] at [phone] with questions or concerns regarding your invoice.   IF you received labwork today, you will receive an invoice from [place]. Please contact [facility] at [phone] with questions or concerns regarding your invoice.   Our billing staff will not be able to assist you with questions regarding bills from these companies.  You will be contacted with the lab results as soon as they are available. The fastest way to get your results is to activate your My Chart account. Instructions are located on the last page of this paperwork. If you have not heard from [place] regarding the results in 2 weeks, please contact this office.     TravelVaccine  Information Vaccines, also called immunizations, can protect you from certain diseases. Vaccines can also prevent the spread of certain infections. It is important to see your health care provider or a travel medicine specialist 4-6 weeks before you travel. This allows time for recommended vaccines to take effect. It also provides enough time for you to get vaccines that must be given in a series over a period of days or weeks. Vaccines for travelers include:  Routine vaccines. These vaccines are standard.  Recommended travel vaccines. These vaccines are generally recommended before international travel.  Geographically required travel vaccines. These vaccines are necessary before travel to some countries or regions.  If it is less than 4 weeks before you leave,  you should still see your health care provider. You might still benefit from vaccines or medicines. What are routine vaccines? Routine vaccines are shots that can protect you from common diseases in many parts of the world. Most routine vaccines are given at certain ages starting in childhood. Routine vaccines also include the annual flu (influenza) vaccine. It is important that you are up to date on your routine vaccines before you travel. You may be advised to get extra doses, also called booster vaccines, such as the Tdap (tetanus, diphtheria, and pertussis). What are recommended vaccines? Recommended travel vaccines change over time. Your health care provider can tell you what vaccines are recommended before your trip. The most common recommended vaccines before travel are hepatitis A and typhoid vaccines. Know your travel schedule when you visit your health care provider. The vaccines that are recommended before foreign travel will depend on several factors, such as:  The country or countries of travel.  Whether you will be traveling to rural areas.  How long you will be traveling.  The season of the year.  Your age. Older adults  should get a vaccine against a certain type of pneumonia (pneumococcal) and a vaccine against shingles (herpes zoster).  Your health status.  Your previous vaccines.  The annual influenza vaccine sometimes differs for the [place] and [place] hemispheres. You should:  Get both vaccines if you are traveling to the other hemisphere, and you have a chronic medical condition.  Get the vaccine shortly before or during the flu season, and only if the vaccine in your country differs from the vaccine in your destination country.  Get the other influenza vaccine either before leaving the country or shortly after arriving at the destination country.  What are geographically required vaccines? Children should be up to date with all of the recommended vaccinations. Parents should follow the standard vaccination guidelines that are recommended by the pediatrician. Some vaccines may be required during an ongoing outbreak of an infectious disease in a country or region. Your health care provider will be able to tell you about any outbreaks and required vaccines. Some examples of required vaccines include: Yellow fever vaccine  Proof of yellow fever immunization is currently required for most people before traveling to certain countries in [place] and [place].  If proof of immunization is incomplete or inaccurate, you could be quarantined, denied entry, or given another dose of vaccine at the travel site.  This vaccine can only be obtained at approved centers.  You should get the yellow fever vaccine at least 10 days before your trip.  After 10 days, most people show immunity to yellow fever.  If it has been longer than 10 years since you received the yellow fever vaccine, another dose is required.  Meningococcal vaccine  Meningococcal immunization may be required prior to travel to parts of [place] and [place].  Proof of meningococcal immunization is required by the [place] for any person older than age 61 who is taking part in [place] or [place].  Visas for traveling to take part in hajj or umrah will not even be issued until there is proof of immunization. You should get this vaccine at least 10 days before your trip.  After 10 days, most people show immunity.  If it has been longer than 3 years since your last immunization, another dose is required.  Some travel circumstances may require additional vaccination with the following vaccines:  Hepatitis B.  Rabies.  Tick-borne encephalitis.  Malaria.  Where to find more information:  Centers for Disease Control and Prevention (CDC): http://www.wolf.info/  World Health Organization [facility]): RoleLink.com.br This information is not intended to replace advice given to you by your health care provider. Make sure you discuss any questions you have with your health care provider. Document Released: 02/07/2009 Document Revised: 11/15/2015 Document Reviewed: 07/26/2015 Elsevier Interactive Patient Education  2018 Elsevier Inc.      Agustina Caroli, MD [facility]

## 2017-09-12 ENCOUNTER — Ambulatory Visit: Payer: Managed Care, Other (non HMO) | Admitting: Family Medicine

## 2017-12-20 ENCOUNTER — Other Ambulatory Visit: Payer: Self-pay | Admitting: Family Medicine

## 2017-12-20 MED ORDER — BUDESONIDE-FORMOTEROL FUMARATE 80-4.5 MCG/ACT IN AERO: 2.0000 | INHALATION_SPRAY | Freq: Two times a day (BID) | RESPIRATORY_TRACT | 6 refills | Status: DC

## 2017-12-20 MED ORDER — ALBUTEROL SULFATE HFA 108 (90 BASE) MCG/ACT IN AERS: 1.0000 | INHALATION_SPRAY | RESPIRATORY_TRACT | 1 refills | Status: DC | PRN

## 2017-12-20 NOTE — Telephone Encounter (Signed)
Copied from [place] [facility] Torres [phone]. Topic: Quick Communication - Rx Refill/Question >> Dec 20, 2017 10:01 AM Leward Quan A wrote: Medication: albuterol (PROVENTIL HFA;VENTOLIN HFA) 108 (90 Base) MCG/ACT inhaler, budesonide-formoterol (SYMBICORT) 80-4.5 MCG/ACT inhaler  Has the patient contacted their pharmacy? No   Preferred Pharmacy (with phone number or street name): [id number] - [place], [place] [phone] (Phone) [phone] (Fax)    Agent: Please be advised that RX refills may take up to 3 business days. We ask that you follow-up with your pharmacy.

## 2018-03-24 ENCOUNTER — Ambulatory Visit: Payer: 59 | Admitting: Family Medicine

## 2018-04-12 ENCOUNTER — Other Ambulatory Visit: Payer: Self-pay | Admitting: Family Medicine

## 2018-04-14 NOTE — Telephone Encounter (Signed)
Patient called, unable to leave VM due to mailbox not set up. Patient will need an appointment before refills.

## 2018-06-23 ENCOUNTER — Other Ambulatory Visit: Payer: Self-pay

## 2018-06-23 ENCOUNTER — Telehealth (INDEPENDENT_AMBULATORY_CARE_PROVIDER_SITE_OTHER): Payer: Self-pay | Admitting: Family Medicine

## 2018-06-23 DIAGNOSIS — J45909 Unspecified asthma, uncomplicated: Secondary | ICD-10-CM

## 2018-06-23 MED ORDER — ALBUTEROL SULFATE HFA 108 (90 BASE) MCG/ACT IN AERS: 1.0000 | INHALATION_SPRAY | RESPIRATORY_TRACT | 1 refills | Status: DC | PRN

## 2018-06-23 MED ORDER — BUDESONIDE-FORMOTEROL FUMARATE 80-4.5 MCG/ACT IN AERO: 2.0000 | INHALATION_SPRAY | Freq: Two times a day (BID) | RESPIRATORY_TRACT | 6 refills | Status: DC

## 2018-06-23 NOTE — Progress Notes (Signed)
Virtual Visit via Telephone Note  I connected with Matthew Brooks on 06/23/18 at 5:20 PM by telephone and verified that I am speaking with the correct person using two identifiers.   I discussed the limitations, risks, security and privacy concerns of performing an evaluation and management service by telephone and the availability of in person appointments. I also discussed with the patient that there may be a patient responsible charge related to this service. The patient expressed understanding and agreed to proceed, consent obtained  Chief complaint:  Asthma   History of Present Illness:  Asthma: Mild persistent asthma in the past, last discussed in January 2019.  Prior to that time we had discussed PRN dosing of medications versus daily preventative treatments.  As of that visit he was on Symbicort 2 puffs twice daily which was working better than previous Pulmicort.  Albuterol only every 2 weeks.    Ran out of meds few weeks ago.  Had been doing well on symbicort - 2 puffs bid - out in January.  Albuterol used once per day off   Patient Active Problem List   Diagnosis Date Noted  . Requires typhoid vaccination 08/16/2017  . Need for malaria prophylaxis 08/16/2017  . Asthma 05/31/2015  . Eczema 05/31/2015   Past Medical History:  Diagnosis Date  . Asthma    No past surgical history on file. Allergies  Allergen Reactions  . Augmentin [Amoxicillin-Pot Clavulanate] Nausea And Vomiting   Prior to Admission medications   Medication Sig Start Date End Date Taking? Authorizing Provider  albuterol (PROVENTIL HFA;VENTOLIN HFA) 108 (90 Base) MCG/ACT inhaler Inhale 1-2 puffs into the lungs every 4 (four) hours as needed for wheezing or shortness of breath. 12/20/17  Yes Wendie Agreste, MD  budesonide-formoterol [facility]) 80-4.5 MCG/ACT inhaler Inhale 2 puffs into the lungs 2 (two) times daily. 12/20/17  Yes Wendie Agreste, MD   Social History   Socioeconomic History  .  Marital status: Married    Spouse name: Not on file  . Number of children: Not on file  . Years of education: Not on file  . Highest education level: Not on file  Occupational History  . Not on file  Social Needs  . Financial resource strain: Not on file  . Food insecurity:    Worry: Not on file    Inability: Not on file  . Transportation needs:    Medical: Not on file    Non-medical: Not on file  Tobacco Use  . Smoking status: Never Smoker  . Smokeless tobacco: Never Used  Substance and Sexual Activity  . Alcohol use: No  . Drug use: No  . Sexual activity: Yes  Lifestyle  . Physical activity:    Days per week: Not on file    Minutes per session: Not on file  . Stress: Not on file  Relationships  . Social connections:    Talks on phone: Not on file    Gets together: Not on file    Attends religious service: Not on file    Active member of club or organization: Not on file    Attends meetings of clubs or organizations: Not on file    Relationship status: Not on file  . Intimate partner violence:    Fear of current or ex partner: Not on file    Emotionally abused: Not on file    Physically abused: Not on file    Forced sexual activity: Not on file  Other Topics Concern  .  Not on file  Social History Narrative  . Not on file     Observations/Objective: Speaking normally - no respiratory distress.  Assessment and Plan: Persistent asthma without complication, unspecified asthma severity - Plan: budesonide-formoterol (SYMBICORT) 80-4.5 MCG/ACT inhaler, albuterol (VENTOLIN HFA) 108 (90 Base) MCG/ACT inhaler  -Restart Symbicort.  Albuterol as needed for breakthrough symptoms but anticipate improved control back on Symbicort.  RTC precautions if frequent use of albuterol.  Difference between PRN medications and scheduled medications discussed.  Understanding expressed Recheck 6 weeks if stable, sooner if frequent albuterol need.   Follow Up Instructions: 6 months -  asthma.   Patient Instructions   Restart Symbicort 2 puffs in the morning and 2 puffs at night daily for asthma treatment maintenance.  If shortness of breath or wheezing, can use albuterol, but that is the rescue inhaler.  That should not be needed as much if you take the Symbicort.  After you have restarted Symbicort if you require albuterol more than once or twice a week, please schedule another visit.  Follow-up with me in 6 months if you are doing well.   Asthma, Adult  Asthma is a long-term (chronic) condition that causes recurrent episodes in which the airways become tight and narrow. The airways are the passages that lead from the nose and mouth down into the lungs. Asthma episodes, also called asthma attacks, can cause coughing, wheezing, shortness of breath, and chest pain. The airways can also fill with mucus. During an attack, it can be difficult to breathe. Asthma attacks can range from minor to life threatening. Asthma cannot be cured, but medicines and lifestyle changes can help control it and treat acute attacks. What are the causes? This condition is believed to be caused by inherited (genetic) and environmental factors, but its exact cause is not known. There are many things that can bring on an asthma attack or make asthma symptoms worse (triggers). Asthma triggers are different for each person. Common triggers include:  Mold.  Dust.  Cigarette smoke.  Cockroaches.  Things that can cause allergy symptoms (allergens), such as animal dander or pollen from trees or grass.  Air pollutants such as household cleaners, wood smoke, smog, or [employer] account planner.  Cold air, weather changes, and winds (which increase molds and pollen in the air).  Strong emotional expressions such as crying or laughing hard.  Stress.  Certain medicines (such as aspirin) or types of medicines (such as beta-blockers).  Sulfites in foods and drinks. Foods and drinks that may contain sulfites  include dried fruit, potato chips, and sparkling grape juice.  Infections or inflammatory conditions such as the flu, a cold, or inflammation of the nasal membranes (rhinitis).  Gastroesophageal reflux disease (GERD).  Exercise or strenuous activity. What are the signs or symptoms? Symptoms of this condition may occur right after asthma is triggered or many hours later. Symptoms include:  Wheezing. This can sound like whistling when you breathe.  Excessive nighttime or early morning coughing.  Frequent or severe coughing with a common cold.  Chest tightness.  Shortness of breath.  Tiredness (fatigue) with minimal activity. How is this diagnosed? This condition is diagnosed based on:  Your medical history.  A physical exam.  Tests, which may include: ? Lung function studies and pulmonary studies (spirometry). These tests can evaluate the flow of air in your lungs. ? Allergy tests. ? Imaging tests, such as X-rays. How is this treated? There is no cure for this condition, but treatment can help control your  symptoms. Treatment for asthma usually involves:  Identifying and avoiding your asthma triggers.  Using medicines to control your symptoms. Generally, two types of medicines are used to treat asthma: ? Controller medicines. These help prevent asthma symptoms from occurring. They are usually taken every day. ? Fast-acting reliever or rescue medicines. These quickly relieve asthma symptoms by widening the narrow and tight airways. They are used as needed and provide short-term relief.  Using supplemental oxygen. This may be needed during a severe episode.  Using other medicines, such as: ? Allergy medicines, such as antihistamines, if your asthma attacks are triggered by allergens. ? Immune medicines (immunomodulators). These are medicines that help control the immune system.  Creating an asthma action plan. An asthma action plan is a written plan for managing and  treating your asthma attacks. This plan includes: ? A list of your asthma triggers and how to avoid them. ? Information about when medicines should be taken and when their dosage should be changed. ? Instructions about using a device called a peak flow meter. A peak flow meter measures how well the lungs are working and the severity of your asthma. It helps you monitor your condition. Follow these instructions at home: Controlling your home environment Control your home environment in the following ways to help avoid triggers and prevent asthma attacks:  Change your heating and air conditioning filter regularly.  Limit your use of fireplaces and wood stoves.  Get rid of pests (such as roaches and mice) and their droppings.  Throw away plants if you see mold on them.  Clean floors and dust surfaces regularly. Use unscented cleaning products.  Try to have someone else vacuum for you regularly. Stay out of rooms while they are being vacuumed and for a short while afterward. If you vacuum, use a dust mask from a hardware store, a double-layered or microfilter vacuum cleaner bag, or a vacuum cleaner with a HEPA filter.  Replace carpet with wood, tile, or vinyl flooring. Carpet can trap dander and dust.  Use allergy-proof pillows, mattress covers, and box spring covers.  Keep your bedroom a trigger-free room.  Avoid pets and keep windows closed when allergens are in the air.  Wash beddings every week in hot water and dry them in a dryer.  Use blankets that are made of polyester or cotton.  Clean bathrooms and kitchens with bleach. If possible, have someone repaint the walls in these rooms with mold-resistant paint. Stay out of the rooms that are being cleaned and painted.  Wash your hands often with soap and water. If soap and water are not available, use hand sanitizer.  Do not allow anyone to smoke in your home. General instructions  Take over-the-counter and prescription medicines  only as told by your health care provider. ? Speak with your health care provider if you have questions about how or when to take the medicines. ? Make note if you are requiring more frequent dosages.  Do not use any products that contain nicotine or tobacco, such as cigarettes and e-cigarettes. If you need help quitting, ask your health care provider. Also, avoid being exposed to secondhand smoke.  Use a peak flow meter as told by your health care provider. Record and keep track of the readings.  Understand and use the asthma action plan to help minimize, or stop an asthma attack, without needing to seek medical care.  Make sure you stay up to date on your yearly vaccinations as told by your health care  provider. This may include vaccines for the flu and pneumonia.  Avoid outdoor activities when allergen counts are high and when air quality is low.  Wear a ski mask that covers your nose and mouth during outdoor winter activities. Exercise indoors on cold days if you can.  Warm up before exercising, and take time for a cool-down period after exercise.  Keep all follow-up visits as told by your health care provider. This is important. Where to find more information  For information about asthma, turn to the Centers for Disease Control and Prevention at http://www.clark.net/.htm  For air quality information, turn to [employer] at WeightRating.nl Contact a health care provider if:  You have wheezing, shortness of breath, or a cough even while you are taking medicine to prevent attacks.  The mucus you cough up (sputum) is thicker than usual.  Your sputum changes from clear or white to yellow, green, gray, or bloody.  Your medicines are causing side effects, such as a rash, itching, swelling, or trouble breathing.  You need to use a reliever medicine more than 2-3 times a week.  Your peak flow reading is still at 50-79% of your personal best after following your action plan for 1  hour.  You have a fever. Get help right away if:  You are getting worse and do not respond to treatment during an asthma attack.  You are short of breath when at rest or when doing very little physical activity.  You have difficulty eating, drinking, or talking.  You have chest pain or tightness.  You develop a fast heartbeat or palpitations.  You have a bluish color to your lips or fingernails.  You are light-headed or dizzy, or you faint.  Your peak flow reading is less than 50% of your personal best.  You feel too tired to breathe normally. Summary  Asthma is a long-term (chronic) condition that causes recurrent episodes in which the airways become tight and narrow. These episodes can cause coughing, wheezing, shortness of breath, and chest pain.  Asthma cannot be cured, but medicines and lifestyle changes can help control it and treat acute attacks.  Make sure you understand how to avoid triggers and how and when to use your medicines.  Asthma attacks can range from minor to life threatening. Get help right away if you have an asthma attack and do not respond to treatment with your usual rescue medicines. This information is not intended to replace advice given to you by your health care provider. Make sure you discuss any questions you have with your health care provider. Document Released: 02/19/2005 Document Revised: 03/26/2016 Document Reviewed: 03/26/2016 Elsevier Interactive Patient Education  [employer].   If you have lab work done today you will be contacted with your lab results within the next 2 weeks.  If you have not heard from [place] then please contact us. The fastest way to get your results is to register for My Chart.   IF you received an x-ray today, you will receive an invoice from [facility]. Please contact [facility] at [phone] with questions or concerns regarding your invoice.   IF you received labwork today, you will  receive an invoice from [facility]. Please contact [facility] at [phone] with questions or concerns regarding your invoice.   Our billing staff will not be able to assist you with questions regarding bills from these companies.  You will be contacted with the lab results as soon as they are available. The fastest way to  get your results is to activate your My Chart account. Instructions are located on the last page of this paperwork. If you have not heard from [place] regarding the results in 2 weeks, please contact this office.          I discussed the assessment and treatment plan with the patient. The patient was provided an opportunity to ask questions and all were answered. The patient agreed with the plan and demonstrated an understanding of the instructions.   The patient was advised to call back or seek an in-person evaluation if the symptoms worsen or if the condition fails to improve as anticipated.  I provided 5 minutes of non-face-to-face time during this encounter.  Signed,   Merri Ray, MD [facility].  06/23/18

## 2018-06-23 NOTE — Patient Instructions (Addendum)
Restart Symbicort 2 puffs in the morning and 2 puffs at night daily for asthma treatment maintenance.  If shortness of breath or wheezing, can use albuterol, but that is the rescue inhaler.  That should not be needed as much if you take the Symbicort.  After you have restarted Symbicort if you require albuterol more than once or twice a week, please schedule another visit.  Follow-up with me in 6 months if you are doing well.   Asthma, Adult  Asthma is a long-term (chronic) condition that causes recurrent episodes in which the airways become tight and narrow. The airways are the passages that lead from the nose and mouth down into the lungs. Asthma episodes, also called asthma attacks, can cause coughing, wheezing, shortness of breath, and chest pain. The airways can also fill with mucus. During an attack, it can be difficult to breathe. Asthma attacks can range from minor to life threatening. Asthma cannot be cured, but medicines and lifestyle changes can help control it and treat acute attacks. What are the causes? This condition is believed to be caused by inherited (genetic) and environmental factors, but its exact cause is not known. There are many things that can bring on an asthma attack or make asthma symptoms worse (triggers). Asthma triggers are different for each person. Common triggers include:  Mold.  Dust.  Cigarette smoke.  Cockroaches.  Things that can cause allergy symptoms (allergens), such as animal dander or pollen from trees or grass.  Air pollutants such as household cleaners, wood smoke, smog, or Advertising account planner.  Cold air, weather changes, and winds (which increase molds and pollen in the air).  Strong emotional expressions such as crying or laughing hard.  Stress.  Certain medicines (such as aspirin) or types of medicines (such as beta-blockers).  Sulfites in foods and drinks. Foods and drinks that may contain sulfites include dried fruit, potato chips, and  sparkling grape juice.  Infections or inflammatory conditions such as the flu, a cold, or inflammation of the nasal membranes (rhinitis).  Gastroesophageal reflux disease (GERD).  Exercise or strenuous activity. What are the signs or symptoms? Symptoms of this condition may occur right after asthma is triggered or many hours later. Symptoms include:  Wheezing. This can sound like whistling when you breathe.  Excessive nighttime or early morning coughing.  Frequent or severe coughing with a common cold.  Chest tightness.  Shortness of breath.  Tiredness (fatigue) with minimal activity. How is this diagnosed? This condition is diagnosed based on:  Your medical history.  A physical exam.  Tests, which may include: ? Lung function studies and pulmonary studies (spirometry). These tests can evaluate the flow of air in your lungs. ? Allergy tests. ? Imaging tests, such as X-rays. How is this treated? There is no cure for this condition, but treatment can help control your symptoms. Treatment for asthma usually involves:  Identifying and avoiding your asthma triggers.  Using medicines to control your symptoms. Generally, two types of medicines are used to treat asthma: ? Controller medicines. These help prevent asthma symptoms from occurring. They are usually taken every day. ? Fast-acting reliever or rescue medicines. These quickly relieve asthma symptoms by widening the narrow and tight airways. They are used as needed and provide short-term relief.  Using supplemental oxygen. This may be needed during a severe episode.  Using other medicines, such as: ? Allergy medicines, such as antihistamines, if your asthma attacks are triggered by allergens. ? Immune medicines (immunomodulators). These are medicines  that help control the immune system.  Creating an asthma action plan. An asthma action plan is a written plan for managing and treating your asthma attacks. This plan  includes: ? A list of your asthma triggers and how to avoid them. ? Information about when medicines should be taken and when their dosage should be changed. ? Instructions about using a device called a peak flow meter. A peak flow meter measures how well the lungs are working and the severity of your asthma. It helps you monitor your condition. Follow these instructions at home: Controlling your home environment Control your home environment in the following ways to help avoid triggers and prevent asthma attacks:  Change your heating and air conditioning filter regularly.  Limit your use of fireplaces and wood stoves.  Get rid of pests (such as roaches and mice) and their droppings.  Throw away plants if you see mold on them.  Clean floors and dust surfaces regularly. Use unscented cleaning products.  Try to have someone else vacuum for you regularly. Stay out of rooms while they are being vacuumed and for a short while afterward. If you vacuum, use a dust mask from a hardware store, a double-layered or microfilter vacuum cleaner bag, or a vacuum cleaner with a HEPA filter.  Replace carpet with wood, tile, or vinyl flooring. Carpet can trap dander and dust.  Use allergy-proof pillows, mattress covers, and box spring covers.  Keep your bedroom a trigger-free room.  Avoid pets and keep windows closed when allergens are in the air.  Wash beddings every week in hot water and dry them in a dryer.  Use blankets that are made of polyester or cotton.  Clean bathrooms and kitchens with bleach. If possible, have someone repaint the walls in these rooms with mold-resistant paint. Stay out of the rooms that are being cleaned and painted.  Wash your hands often with soap and water. If soap and water are not available, use hand sanitizer.  Do not allow anyone to smoke in your home. General instructions  Take over-the-counter and prescription medicines only as told by your health care  provider. ? Speak with your health care provider if you have questions about how or when to take the medicines. ? Make note if you are requiring more frequent dosages.  Do not use any products that contain nicotine or tobacco, such as cigarettes and e-cigarettes. If you need help quitting, ask your health care provider. Also, avoid being exposed to secondhand smoke.  Use a peak flow meter as told by your health care provider. Record and keep track of the readings.  Understand and use the asthma action plan to help minimize, or stop an asthma attack, without needing to seek medical care.  Make sure you stay up to date on your yearly vaccinations as told by your health care provider. This may include vaccines for the flu and pneumonia.  Avoid outdoor activities when allergen counts are high and when air quality is low.  Wear a ski mask that covers your nose and mouth during outdoor winter activities. Exercise indoors on cold days if you can.  Warm up before exercising, and take time for a cool-down period after exercise.  Keep all follow-up visits as told by your health care provider. This is important. Where to find more information  For information about asthma, turn to the Centers for Disease Control and Prevention at http://www.clark.net/.htm  For air quality information, turn to [employer] at WeightRating.nl Contact a health care  provider if:  You have wheezing, shortness of breath, or a cough even while you are taking medicine to prevent attacks.  The mucus you cough up (sputum) is thicker than usual.  Your sputum changes from clear or white to yellow, green, gray, or bloody.  Your medicines are causing side effects, such as a rash, itching, swelling, or trouble breathing.  You need to use a reliever medicine more than 2-3 times a week.  Your peak flow reading is still at 50-79% of your personal best after following your action plan for 1 hour.  You have a fever. Get help  right away if:  You are getting worse and do not respond to treatment during an asthma attack.  You are short of breath when at rest or when doing very little physical activity.  You have difficulty eating, drinking, or talking.  You have chest pain or tightness.  You develop a fast heartbeat or palpitations.  You have a bluish color to your lips or fingernails.  You are light-headed or dizzy, or you faint.  Your peak flow reading is less than 50% of your personal best.  You feel too tired to breathe normally. Summary  Asthma is a long-term (chronic) condition that causes recurrent episodes in which the airways become tight and narrow. These episodes can cause coughing, wheezing, shortness of breath, and chest pain.  Asthma cannot be cured, but medicines and lifestyle changes can help control it and treat acute attacks.  Make sure you understand how to avoid triggers and how and when to use your medicines.  Asthma attacks can range from minor to life threatening. Get help right away if you have an asthma attack and do not respond to treatment with your usual rescue medicines. This information is not intended to replace advice given to you by your health care provider. Make sure you discuss any questions you have with your health care provider. Document Released: 02/19/2005 Document Revised: 03/26/2016 Document Reviewed: 03/26/2016 Elsevier Interactive Patient Education  [employer].   If you have lab work done today you will be contacted with your lab results within the next 2 weeks.  If you have not heard from [place] then please contact us. The fastest way to get your results is to register for My Chart.   IF you received an x-ray today, you will receive an invoice from [facility]. Please contact [facility] at [phone] with questions or concerns regarding your invoice.   IF you received labwork today, you will receive an invoice from [facility] [place]. Please  contact [facility] at [phone] with questions or concerns regarding your invoice.   Our billing staff will not be able to assist you with questions regarding bills from these companies.  You will be contacted with the lab results as soon as they are available. The fastest way to get your results is to activate your My Chart account. Instructions are located on the last page of this paperwork. If you have not heard from [place] regarding the results in 2 weeks, please contact this office.

## 2018-06-23 NOTE — Progress Notes (Signed)
CC- Asthma- need a refill on Inhaler. Use inhaler twice a week for asthma. Need interpreter [phone] for other

## 2018-09-03 IMAGING — DX DG CHEST 2V
2 series · 2 of 2 positions shown · non-contrast
Comparison: 03/27/2015 and prior exams

CLINICAL DATA: Cough and wheezing.

EXAM:
CHEST  2 VIEW

[chest pa]
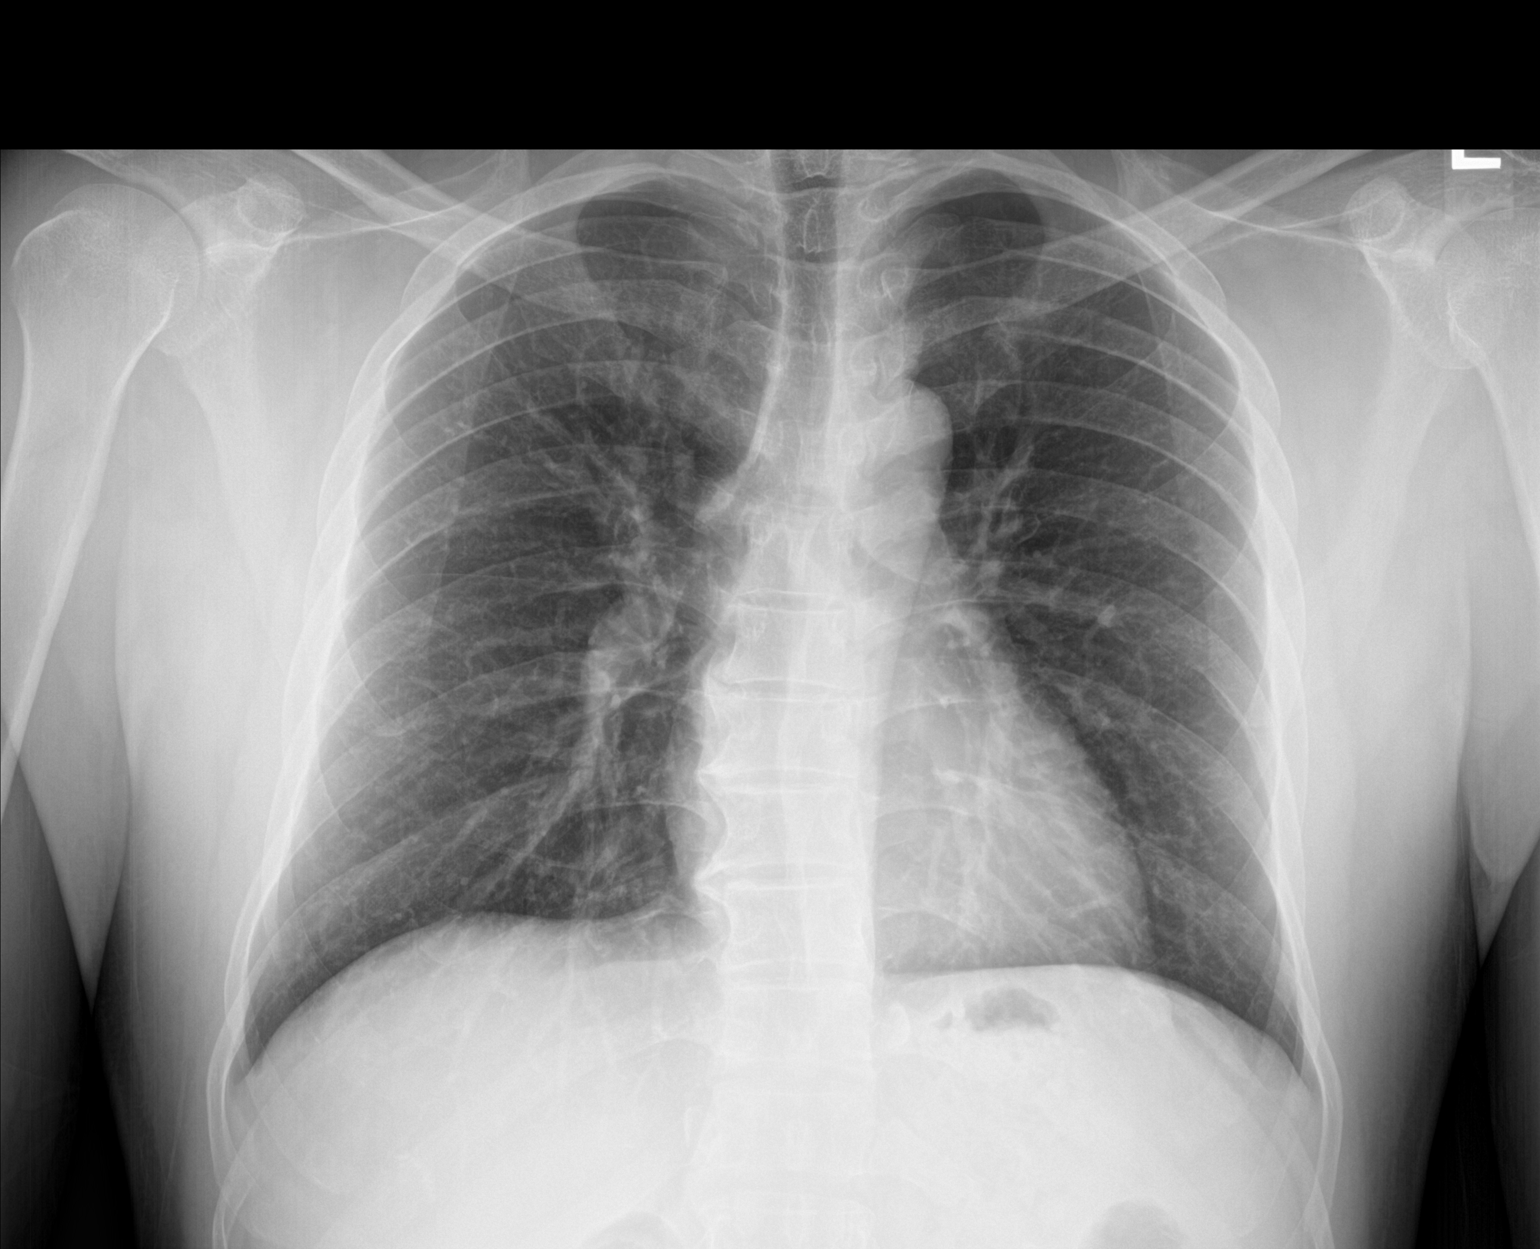

[chest lat]
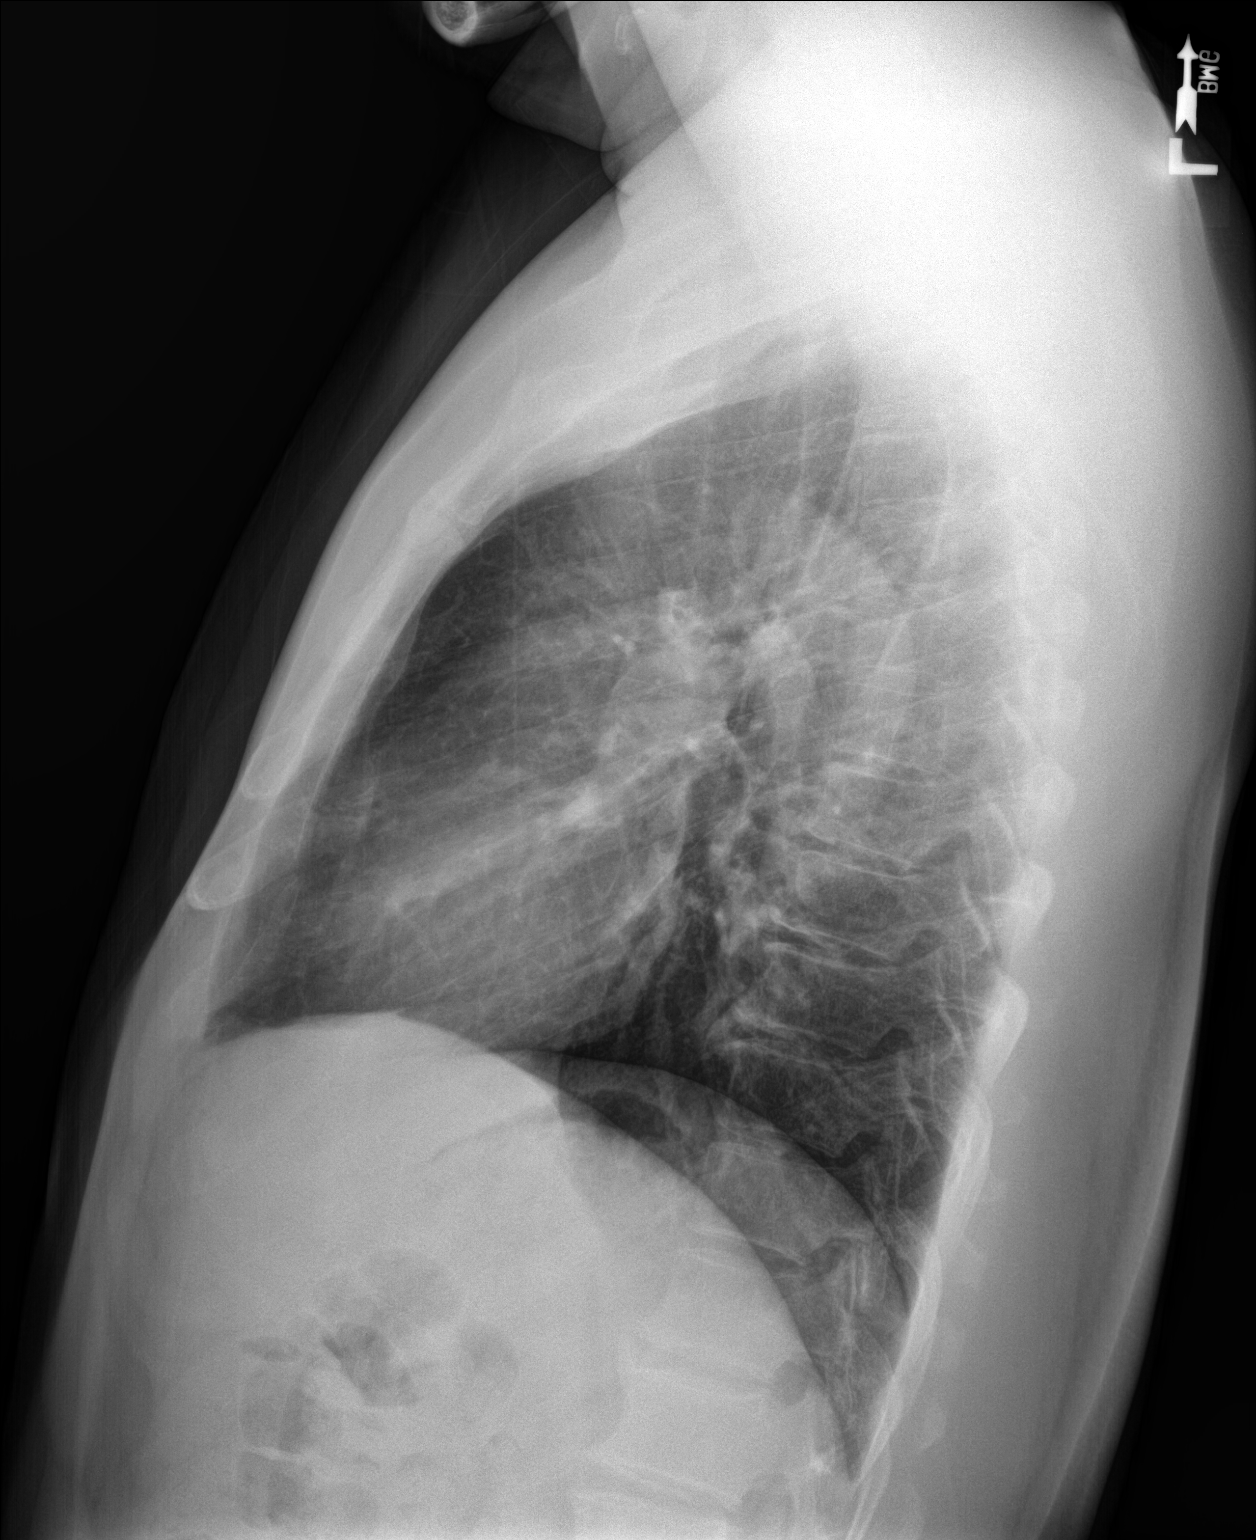

[2 of 2 positions shown; findings below may reference images not displayed]

FINDINGS: The cardiomediastinal silhouette is unremarkable.

Mild peribronchial thickening is unchanged.

There is no evidence of focal airspace disease, pulmonary edema,
suspicious pulmonary nodule/mass, pleural effusion, or pneumothorax.
No acute bony abnormalities are identified.
IMPRESSION: No active cardiopulmonary disease.

## 2018-09-22 ENCOUNTER — Other Ambulatory Visit: Payer: Self-pay | Admitting: Family Medicine

## 2018-09-22 DIAGNOSIS — J45909 Unspecified asthma, uncomplicated: Secondary | ICD-10-CM

## 2018-09-22 NOTE — Telephone Encounter (Signed)
Requested Prescriptions  Pending Prescriptions Disp Refills  . albuterol (VENTOLIN HFA) 108 (90 Base) MCG/ACT inhaler [Pharmacy Med Name: ALBUTEROL HFA (PROVENTIL) INH]  1    Sig: INHALE 1 TO 2 PUFFS INTO THE LUNGS EVERY 4 HOURS AS NEEDED FOR WHEEZING OR SHORTNES OF BREATH     Pulmonology:  Beta Agonists Failed - 09/22/2018  1:15 AM      Failed - One inhaler should last at least one month. If the patient is requesting refills earlier, contact the patient to check for uncontrolled symptoms.      Failed - Valid encounter within last 12 months    Recent Outpatient Visits          1 year ago Need for viral immunization   [facility], [place], [place]   1 year ago Persistent asthma without complication, unspecified asthma severity   [facility], Ran[place] Patrick, [place]   1 year ago Mild persistent asthma without complication   [facility], Ranell Patrick, MD   2 years ago Mild persistent asthma with acute exacerbation   [facility], Ranell Patrick, MD   3 years ago Urticarial rash   [facility], [facility]             Patient given 1 inhaler with 1 refill at [place] on 06/23/2018 as rescue inhaler.  Refill appropriate.

## 2018-11-09 ENCOUNTER — Other Ambulatory Visit: Payer: Self-pay | Admitting: Family Medicine

## 2018-11-09 DIAGNOSIS — J45909 Unspecified asthma, uncomplicated: Secondary | ICD-10-CM

## 2018-11-09 NOTE — Telephone Encounter (Signed)
Pt contacted regarding needing a refill for albuterol inhaler. It is showing that he has a refill at the pharmacy. He will contact the provider for concerns with this refill.

## 2019-02-19 ENCOUNTER — Other Ambulatory Visit: Payer: Self-pay | Admitting: Emergency Medicine

## 2019-02-19 ENCOUNTER — Telehealth: Payer: Self-pay | Admitting: Family Medicine

## 2019-02-19 DIAGNOSIS — J45909 Unspecified asthma, uncomplicated: Secondary | ICD-10-CM

## 2019-02-19 MED ORDER — ALBUTEROL SULFATE HFA 108 (90 BASE) MCG/ACT IN AERS: INHALATION_SPRAY | RESPIRATORY_TRACT | 1 refills | Status: DC

## 2019-02-19 NOTE — Telephone Encounter (Signed)
albuterol albuterol (VENTOLIN HFA) 108 (90 Base) MCG/ACT inhaler   Pt requesting med refill

## 2019-02-19 NOTE — Telephone Encounter (Signed)
Inhaler has been sent to the pharmacy

## 2019-02-20 ENCOUNTER — Ambulatory Visit (INDEPENDENT_AMBULATORY_CARE_PROVIDER_SITE_OTHER): Payer: Self-pay | Admitting: Family Medicine

## 2019-02-20 ENCOUNTER — Encounter: Payer: Self-pay | Admitting: Family Medicine

## 2019-02-20 ENCOUNTER — Other Ambulatory Visit: Payer: Self-pay

## 2019-02-20 DIAGNOSIS — J4530 Mild persistent asthma, uncomplicated: Secondary | ICD-10-CM

## 2019-02-20 MED ORDER — BUDESONIDE-FORMOTEROL FUMARATE 80-4.5 MCG/ACT IN AERO: 2.0000 | INHALATION_SPRAY | Freq: Two times a day (BID) | RESPIRATORY_TRACT | 6 refills | Status: DC

## 2019-02-20 NOTE — Progress Notes (Signed)
Subjective:  Patient ID: Matthew Brooks, male    DOB: Jun 17, 1975  Age: 43 y.o. MRN: [id number]  CC:  Chief Complaint  Patient presents with  . Medication Refill    albuterol     HPI Matthew Brooks presents for   Mild persistent asthma: Previously discussed as needed versus daily medications.  Treated with Symbicort 80/4.5 2 puffs twice daily when discussed in April, ran out few weeks prior.  Had been controlled. Albuterol as needed.  Lost insurance when company closd this year, but new job and insurance restarts January first.  symbicort cost prohibitive - unable to refill d/t cost - off few months. Had only needed albuterol once per week or less when on symbicort.  Albuterol - 1-2 times per day. Helping to control. Dusty work - wearing mask. Last used albuterol 12am - works night shift. Not wheezing/dyspnea now.     History Patient Active Problem List   Diagnosis Date Noted  . Requires typhoid vaccination 08/16/2017  . Need for malaria prophylaxis 08/16/2017  . Asthma 05/31/2015  . Eczema 05/31/2015   Past Medical History:  Diagnosis Date  . Asthma    No past surgical history on file. Allergies  Allergen Reactions  . Augmentin [Amoxicillin-Pot Clavulanate] Nausea And Vomiting   Prior to Admission medications   Medication Sig Start Date End Date Taking? Authorizing Provider  albuterol (VENTOLIN HFA) 108 (90 Base) MCG/ACT inhaler INHALE 1 TO 2 PUFFS INTO THE LUNGS EVERY 4 HOURS AS NEEDED FOR WHEEZING OR SHORTNES OF BREATH 02/19/19  Yes Wendie Agreste, MD  budesonide-formoterol [facility]) 80-4.5 MCG/ACT inhaler Inhale 2 puffs into the lungs 2 (two) times daily. Patient not taking: Reported on 02/20/2019 06/23/18   Wendie Agreste, MD   Social History   Socioeconomic History  . Marital status: Married    Spouse name: Not on file  . Number of children: Not on file  . Years of education: Not on file  . Highest education level: Not on file  Occupational History  . Not on  file  Tobacco Use  . Smoking status: Never Smoker  . Smokeless tobacco: Never Used  Substance and Sexual Activity  . Alcohol use: No  . Drug use: No  . Sexual activity: Yes  Other Topics Concern  . Not on file  Social History Narrative  . Not on file   Social Determinants of Health   Financial Resource Strain:   . Difficulty of Paying Living Expenses: Not on file  Food Insecurity:   . Worried About Charity fundraiser in the Last Year: Not on file  . Ran Out of Food in the Last Year: Not on file  Transportation Needs:   . Lack of Transportation (Medical): Not on file  . Lack of Transportation (Non-Medical): Not on file  Physical Activity:   . Days of Exercise per Week: Not on file  . Minutes of Exercise per Session: Not on file  Stress:   . Feeling of Stress : Not on file  Social Connections:   . Frequency of Communication with Friends and Family: Not on file  . Frequency of Social Gatherings with Friends and Family: Not on file  . Attends Religious Services: Not on file  . Active Member of Clubs or Organizations: Not on file  . Attends Archivist Meetings: Not on file  . Marital Status: Not on file  Intimate Partner Violence:   . Fear of Current or Ex-Partner: Not on file  . Emotionally  Abused: Not on file  . Physically Abused: Not on file  . Sexually Abused: Not on file    Review of Systems As above  Objective:   Vitals:   02/20/19 0957  BP: 140/90  Pulse: 94  Temp: 98.3 F (36.8 C)  TempSrc: Temporal  SpO2: 97%  Weight: 167 lb (75.8 kg)  Height: 5\' 5"  (1.651 m)     Physical Exam Vitals reviewed.  Constitutional:      Appearance: He is well-developed.  HENT:     Head: Normocephalic and atraumatic.     Right Ear: Tympanic membrane, ear canal and external ear normal.     Left Ear: Tympanic membrane, ear canal and external ear normal.     Nose: No rhinorrhea.     Mouth/Throat:     Pharynx: No oropharyngeal exudate or posterior  oropharyngeal erythema.  Eyes:     Conjunctiva/sclera: Conjunctivae normal.     Pupils: Pupils are equal, round, and reactive to light.  Cardiovascular:     Rate and Rhythm: Normal rate and regular rhythm.     Heart sounds: Normal heart sounds. No murmur.  Pulmonary:     Effort: Pulmonary effort is normal. No respiratory distress.     Breath sounds: Wheezing (Faint end expiratory, but no distress.  Overall good airflow.) present. No rhonchi or rales.  Abdominal:     Palpations: Abdomen is soft.     Tenderness: There is no abdominal tenderness.  Musculoskeletal:     Cervical back: Neck supple.  Lymphadenopathy:     Cervical: No cervical adenopathy.  Skin:    General: Skin is warm and dry.     Findings: No rash.  Neurological:     Mental Status: He is alert and oriented to person, place, and time.  Psychiatric:        Behavior: Behavior normal.      Assessment & Plan:  Matthew Brooks is a 43 y.o. male . Mild persistent asthma without complication - Plan: budesonide-formoterol (SYMBICORT) 80-4.5 MCG/ACT inhaler Slight decreased control off Symbicort but that has been cost prohibitive without insurance.  Symbicort discount card provided, good Rx card provided to see if that may make that medication more affordable until his insurance starts January 1.  Continue albuterol for now if he is unable to afford Symbicort until January 1st.   ER/RTC precautions given, recheck 6 months  Meds ordered this encounter  Medications  . budesonide-formoterol (SYMBICORT) 80-4.5 MCG/ACT inhaler    Sig: Inhale 2 puffs into the lungs 2 (two) times daily.    Dispense:  1 Inhaler    Refill:  6   Patient Instructions    Try discount cards to see if Symbicort is less costly.  If you are unable to fill that until January, okay to use albuterol for now.  Follow-up in 6 months to discuss asthma.  If any acute worsening or you are needing albuterol more than a few times per day, return for recheck here or  other medical provider.   Asthma Attack Prevention, Adult Although you may not be able to control the fact that you have asthma, you can take actions to prevent episodes of asthma (asthma attacks). These actions include:  Creating a written plan for managing and treating your asthma attacks (asthma action plan).  Monitoring your asthma.  Avoiding things that can irritate your airways or make your asthma symptoms worse (asthma triggers).  Taking your medicines as directed.  Acting quickly if you have signs or symptoms  of an asthma attack. What are some ways to prevent an asthma attack? Create a plan Work with your health care provider to create an asthma action plan. This plan should include:  A list of your asthma triggers and how to avoid them.  A list of symptoms that you experience during an asthma attack.  Information about when to take medicine and how much medicine to take.  Information to help you understand your peak flow measurements.  Contact information for your health care providers.  Daily actions that you can take to control asthma. Monitor your asthma To monitor your asthma:  Use your peak flow meter every morning and every evening for 2-3 weeks. Record the results in a journal. A drop in your peak flow numbers on one or more days may mean that you are starting to have an asthma attack, even if you are not having symptoms.  When you have asthma symptoms, write them down in a journal.  Avoid asthma triggers Work with your health care provider to find out what your asthma triggers are. This can be done by:  Being tested for allergies.  Keeping a journal that notes when asthma attacks occur and what may have contributed to them.  Asking your health care provider whether other medical conditions make your asthma worse. Common asthma triggers include:  Dust.  Smoke. This includes campfire smoke and secondhand smoke from tobacco products.  Pet dander.   Trees, grasses or pollens.  Very cold, dry, or humid air.  Mold.  Foods that contain high amounts of sulfites.  Strong smells.  Engine exhaust and air pollution.  Aerosol sprays and fumes from household cleaners.  Household pests and their droppings, including dust mites and cockroaches.  Certain medicines, including NSAIDs. Once you have determined your asthma triggers, take steps to avoid them. Depending on your triggers, you may be able to reduce the chance of an asthma attack by:  Keeping your home clean. Have someone dust and vacuum your home for you 1 or 2 times a week. If possible, have them use a high-efficiency particulate arrestance (HEPA) vacuum.  Washing your sheets weekly in hot water.  Using allergy-proof mattress covers and casings on your bed.  Keeping pets out of your home.  Taking care of mold and water problems in your home.  Avoiding areas where people smoke.  Avoiding using strong perfumes or odor sprays.  Avoid spending a lot of time outdoors when pollen counts are high and on very windy days.  Talking with your health care provider before stopping or starting any new medicines. Medicines Take over-the-counter and prescription medicines only as told by your health care provider. Many asthma attacks can be prevented by carefully following your medicine schedule. Taking your medicines correctly is especially important when you cannot avoid certain asthma triggers. Even if you are doing well, do not stop taking your medicine and do not take less medicine. Act quickly If an asthma attack happens, acting quickly can decrease how severe it is and how long it lasts. Take these actions:  Pay attention to your symptoms. If you are coughing, wheezing, or having difficulty breathing, do not wait to see if your symptoms go away on their own. Follow your asthma action plan.  If you have followed your asthma action plan and your symptoms are not improving, call your  health care provider or seek immediate medical care at the nearest hospital. It is important to write down how often you need to use  your fast-acting rescue inhaler. You can track how often you use an inhaler in your journal. If you are using your rescue inhaler more often, it may mean that your asthma is not under control. Adjusting your asthma treatment plan may help you to prevent future asthma attacks and help you to gain better control of your condition. How can I prevent an asthma attack when I exercise? Exercise is a common asthma trigger. To prevent asthma attacks during exercise:  Follow advice from your health care provider about whether you should use your fast-acting inhaler before exercising. Many people with asthma experience exercise-induced bronchoconstriction (EIB). This condition often worsens during vigorous exercise in cold, humid, or dry environments. Usually, people with EIB can stay very active by using a fast-acting inhaler before exercising.  Avoid exercising outdoors in very cold or humid weather.  Avoid exercising outdoors when pollen counts are high.  Warm up and cool down when exercising.  Stop exercising right away if asthma symptoms start. Consider taking part in exercises that are less likely to cause asthma symptoms such as:  Indoor swimming.  Biking.  Walking.  Hiking.  Playing football. This information is not intended to replace advice given to you by your health care provider. Make sure you discuss any questions you have with your health care provider. Document Released: 02/07/2009 Document Revised: 02/01/2017 Document Reviewed: 08/06/2015 Elsevier Patient Education  [employer].   If you have lab work done today you will be contacted with your lab results within the next 2 weeks.  If you have not heard from [place] then please contact us. The fastest way to get your results is to register for My Chart.   IF you received an x-ray today, you will  receive an invoice from [facility]. Please contact [facility] at [phone] with questions or concerns regarding your invoice.   IF you received labwork today, you will receive an invoice from [place]. Please contact [facility] at [phone] with questions or concerns regarding your invoice.   Our billing staff will not be able to assist you with questions regarding bills from these companies.  You will be contacted with the lab results as soon as they are available. The fastest way to get your results is to activate your My Chart account. Instructions are located on the last page of this paperwork. If you have not heard from [place] regarding the results in 2 weeks, please contact this office.          Signed, Merri Ray, MD [facility]

## 2019-02-20 NOTE — Patient Instructions (Addendum)
Try discount cards to see if Symbicort is less costly.  If you are unable to fill that until January, okay to use albuterol for now.  Follow-up in 6 months to discuss asthma.  If any acute worsening or you are needing albuterol more than a few times per day, return for recheck here or other medical provider.   Asthma Attack Prevention, Adult Although you may not be able to control the fact that you have asthma, you can take actions to prevent episodes of asthma (asthma attacks). These actions include:  Creating a written plan for managing and treating your asthma attacks (asthma action plan).  Monitoring your asthma.  Avoiding things that can irritate your airways or make your asthma symptoms worse (asthma triggers).  Taking your medicines as directed.  Acting quickly if you have signs or symptoms of an asthma attack. What are some ways to prevent an asthma attack? Create a plan Work with your health care provider to create an asthma action plan. This plan should include:  A list of your asthma triggers and how to avoid them.  A list of symptoms that you experience during an asthma attack.  Information about when to take medicine and how much medicine to take.  Information to help you understand your peak flow measurements.  Contact information for your health care providers.  Daily actions that you can take to control asthma. Monitor your asthma To monitor your asthma:  Use your peak flow meter every morning and every evening for 2-3 weeks. Record the results in a journal. A drop in your peak flow numbers on one or more days may mean that you are starting to have an asthma attack, even if you are not having symptoms.  When you have asthma symptoms, write them down in a journal.  Avoid asthma triggers Work with your health care provider to find out what your asthma triggers are. This can be done by:  Being tested for allergies.  Keeping a journal that notes when asthma  attacks occur and what may have contributed to them.  Asking your health care provider whether other medical conditions make your asthma worse. Common asthma triggers include:  Dust.  Smoke. This includes campfire smoke and secondhand smoke from tobacco products.  Pet dander.  Trees, grasses or pollens.  Very cold, dry, or humid air.  Mold.  Foods that contain high amounts of sulfites.  Strong smells.  Engine exhaust and air pollution.  Aerosol sprays and fumes from household cleaners.  Household pests and their droppings, including dust mites and cockroaches.  Certain medicines, including NSAIDs. Once you have determined your asthma triggers, take steps to avoid them. Depending on your triggers, you may be able to reduce the chance of an asthma attack by:  Keeping your home clean. Have someone dust and vacuum your home for you 1 or 2 times a week. If possible, have them use a high-efficiency particulate arrestance (HEPA) vacuum.  Washing your sheets weekly in hot water.  Using allergy-proof mattress covers and casings on your bed.  Keeping pets out of your home.  Taking care of mold and water problems in your home.  Avoiding areas where people smoke.  Avoiding using strong perfumes or odor sprays.  Avoid spending a lot of time outdoors when pollen counts are high and on very windy days.  Talking with your health care provider before stopping or starting any new medicines. Medicines Take over-the-counter and prescription medicines only as told by your health care  provider. Many asthma attacks can be prevented by carefully following your medicine schedule. Taking your medicines correctly is especially important when you cannot avoid certain asthma triggers. Even if you are doing well, do not stop taking your medicine and do not take less medicine. Act quickly If an asthma attack happens, acting quickly can decrease how severe it is and how long it lasts. Take these  actions:  Pay attention to your symptoms. If you are coughing, wheezing, or having difficulty breathing, do not wait to see if your symptoms go away on their own. Follow your asthma action plan.  If you have followed your asthma action plan and your symptoms are not improving, call your health care provider or seek immediate medical care at the nearest hospital. It is important to write down how often you need to use your fast-acting rescue inhaler. You can track how often you use an inhaler in your journal. If you are using your rescue inhaler more often, it may mean that your asthma is not under control. Adjusting your asthma treatment plan may help you to prevent future asthma attacks and help you to gain better control of your condition. How can I prevent an asthma attack when I exercise? Exercise is a common asthma trigger. To prevent asthma attacks during exercise:  Follow advice from your health care provider about whether you should use your fast-acting inhaler before exercising. Many people with asthma experience exercise-induced bronchoconstriction (EIB). This condition often worsens during vigorous exercise in cold, humid, or dry environments. Usually, people with EIB can stay very active by using a fast-acting inhaler before exercising.  Avoid exercising outdoors in very cold or humid weather.  Avoid exercising outdoors when pollen counts are high.  Warm up and cool down when exercising.  Stop exercising right away if asthma symptoms start. Consider taking part in exercises that are less likely to cause asthma symptoms such as:  Indoor swimming.  Biking.  Walking.  Hiking.  Playing football. This information is not intended to replace advice given to you by your health care provider. Make sure you discuss any questions you have with your health care provider. Document Released: 02/07/2009 Document Revised: 02/01/2017 Document Reviewed: 08/06/2015 Elsevier Patient Education   [employer].   If you have lab work done today you will be contacted with your lab results within the next 2 weeks.  If you have not heard from [place] then please contact us. The fastest way to get your results is to register for My Chart.   IF you received an x-ray today, you will receive an invoice from [facility]. Please contact [facility] at [phone] with questions or concerns regarding your invoice.   IF you received labwork today, you will receive an invoice from [place]. Please contact [facility] at [phone] with questions or concerns regarding your invoice.   Our billing staff will not be able to assist you with questions regarding bills from these companies.  You will be contacted with the lab results as soon as they are available. The fastest way to get your results is to activate your My Chart account. Instructions are located on the last page of this paperwork. If you have not heard from [place] regarding the results in 2 weeks, please contact this office.

## 2019-04-03 ENCOUNTER — Other Ambulatory Visit: Payer: Self-pay | Admitting: Family Medicine

## 2019-04-03 DIAGNOSIS — J45909 Unspecified asthma, uncomplicated: Secondary | ICD-10-CM

## 2019-04-03 NOTE — Telephone Encounter (Signed)
Forwarding medication refill request to the clinical pool for review. 

## 2019-07-05 ENCOUNTER — Other Ambulatory Visit: Payer: Self-pay | Admitting: Family Medicine

## 2019-07-05 DIAGNOSIS — J45909 Unspecified asthma, uncomplicated: Secondary | ICD-10-CM

## 2019-07-05 NOTE — Telephone Encounter (Signed)
Requested Prescriptions  Pending Prescriptions Disp Refills  . albuterol (VENTOLIN HFA) 108 (90 Base) MCG/ACT inhaler [Pharmacy Med Name: ALBUTEROL HFA (PROAIR) INHALER] 8.5 g 3    Sig: INHALE 1 TO 2 PUFFS INTO THE LUNGS EVERY 4 HOURS AS NEEDED FOR WHEEZING OR SHORTNES OF BREATH     Pulmonology:  Beta Agonists Failed - 07/05/2019  9:42 AM      Failed - One inhaler should last at least one month. If the patient is requesting refills earlier, contact the patient to check for uncontrolled symptoms.      Passed - Valid encounter within last 12 months    Recent Outpatient Visits          4 months ago Mild persistent asthma without complication   [facility], Ranell Patrick, MD   1 year ago Persistent asthma without complication, unspecified asthma severity   [facility], Ranell Patrick, MD   1 year ago Need for viral immunization   [facility], [place], MD   2 years ago Persistent asthma without complication, unspecified asthma severity   [facility], Ranell Patrick, MD   2 years ago Mild persistent asthma without complication   [facility], Ranell Patrick, MD

## 2019-10-30 ENCOUNTER — Other Ambulatory Visit: Payer: Self-pay | Admitting: Family Medicine

## 2019-10-30 DIAGNOSIS — J45909 Unspecified asthma, uncomplicated: Secondary | ICD-10-CM

## 2020-02-01 ENCOUNTER — Other Ambulatory Visit: Payer: Self-pay | Admitting: Family Medicine

## 2020-02-01 DIAGNOSIS — J45909 Unspecified asthma, uncomplicated: Secondary | ICD-10-CM

## 2020-02-17 ENCOUNTER — Other Ambulatory Visit: Payer: Self-pay | Admitting: Family Medicine

## 2020-02-17 DIAGNOSIS — J45909 Unspecified asthma, uncomplicated: Secondary | ICD-10-CM

## 2020-04-21 ENCOUNTER — Other Ambulatory Visit: Payer: Self-pay

## 2020-04-21 ENCOUNTER — Encounter: Payer: Self-pay | Admitting: Family Medicine

## 2020-04-21 ENCOUNTER — Ambulatory Visit (INDEPENDENT_AMBULATORY_CARE_PROVIDER_SITE_OTHER): Payer: BC Managed Care – PPO | Admitting: Family Medicine

## 2020-04-21 DIAGNOSIS — J4530 Mild persistent asthma, uncomplicated: Secondary | ICD-10-CM

## 2020-04-21 DIAGNOSIS — J45909 Unspecified asthma, uncomplicated: Secondary | ICD-10-CM | POA: Diagnosis not present

## 2020-04-21 MED ORDER — BUDESONIDE-FORMOTEROL FUMARATE 80-4.5 MCG/ACT IN AERO: 2.0000 | INHALATION_SPRAY | Freq: Two times a day (BID) | RESPIRATORY_TRACT | 6 refills | Status: DC

## 2020-04-21 MED ORDER — ALBUTEROL SULFATE HFA 108 (90 BASE) MCG/ACT IN AERS: INHALATION_SPRAY | RESPIRATORY_TRACT | 2 refills | Status: DC

## 2020-04-21 NOTE — Progress Notes (Signed)
Subjective:  Patient ID: Matthew Brooks, male    DOB: 12-28-1975  Age: 45 y.o. MRN: [id number]  CC:  Chief Complaint  Patient presents with  . Medication Refill    Pt would like a refill on his albuterol inhaler. PT reports in the winter he uses it more because the cold makes it hard to breath and in the warmer months he only uses it sometimes. Pt reports the medication works well with no side effects.    HPI Matthew Brooks presents for   Mild persistent asthma Decreased control with discussed in 2020, restarted Symbicort with albuterol as needed.symbicort cost prohibitive off insurance  - now has coverage.   In cold weather - albuterol 3-4 times/day.  Warmer weather - once per day.   History Patient Active Problem List   Diagnosis Date Noted  . Requires typhoid vaccination 08/16/2017  . Need for malaria prophylaxis 08/16/2017  . Asthma 05/31/2015  . Eczema 05/31/2015   Past Medical History:  Diagnosis Date  . Asthma    History reviewed. No pertinent surgical history. Allergies  Allergen Reactions  . Augmentin [Amoxicillin-Pot Clavulanate] Nausea And Vomiting   Prior to Admission medications   Medication Sig Start Date End Date Taking? Authorizing Provider  albuterol (VENTOLIN HFA) 108 (90 Base) MCG/ACT inhaler INHALE 1 TO 2 PUFFS INTO THE LUNGS EVERY 4 HOURS AS NEEDED FOR WHEEZING OR SHORTNES OF BREATH 02/01/20  Yes Wendie Agreste, MD  budesonide-formoterol [facility]) 80-4.5 MCG/ACT inhaler Inhale 2 puffs into the lungs 2 (two) times daily. Patient not taking: Reported on 04/21/2020 02/20/19   Wendie Agreste, MD   Social History   Socioeconomic History  . Marital status: Married    Spouse name: Not on file  . Number of children: Not on file  . Years of education: Not on file  . Highest education level: Not on file  Occupational History  . Not on file  Tobacco Use  . Smoking status: Never Smoker  . Smokeless tobacco: Never Used  Substance and Sexual Activity  .  Alcohol use: No  . Drug use: No  . Sexual activity: Yes  Other Topics Concern  . Not on file  Social History Narrative  . Not on file   Social Determinants of Health   Financial Resource Strain: Not on file  Food Insecurity: Not on file  Transportation Needs: Not on file  Physical Activity: Not on file  Stress: Not on file  Social Connections: Not on file  Intimate Partner Violence: Not on file    Review of Systems   Objective:   Vitals:   04/21/20 1317  BP: 131/83  Pulse: 97  Temp: 98 F (36.7 C)  TempSrc: Temporal  SpO2: 96%  Weight: 164 lb (74.4 kg)  Height: 5\' 5"  (1.651 m)     Physical Exam Vitals reviewed.  Constitutional:      Appearance: He is well-developed and well-nourished.  HENT:     Head: Normocephalic and atraumatic.  Eyes:     Extraocular Movements: EOM normal.     Pupils: Pupils are equal, round, and reactive to light.  Neck:     Vascular: No carotid bruit or JVD.  Cardiovascular:     Rate and Rhythm: Normal rate and regular rhythm.     Heart sounds: Normal heart sounds. No murmur heard.   Pulmonary:     Effort: Pulmonary effort is normal. No respiratory distress.     Breath sounds: Normal breath sounds. No stridor. No wheezing,  rhonchi or rales.  Musculoskeletal:        General: No edema.  Skin:    General: Skin is warm and dry.  Neurological:     Mental Status: He is alert and oriented to person, place, and time.  Psychiatric:        Mood and Affect: Mood and affect normal.      Assessment & Plan:  Matthew Brooks is a 45 y.o. male . Persistent asthma without complication, unspecified asthma severity - Plan: albuterol (VENTOLIN HFA) 108 (90 Base) MCG/ACT inhaler  Mild persistent asthma without complication - Plan: budesonide-formoterol (SYMBICORT) 80-4.5 MCG/ACT inhaler  Decreased control with monotherapy of albuterol.  We will try Symbicort again as may be more cost effective with insurance, discount card provided.  Advised to  call if still too costly.  Difference between maintenance inhaler as well as rescue inhaler discussed with understanding expressed.  Recheck 3 months.   Meds ordered this encounter  Medications  . budesonide-formoterol (SYMBICORT) 80-4.5 MCG/ACT inhaler    Sig: Inhale 2 puffs into the lungs 2 (two) times daily.    Dispense:  1 each    Refill:  6  . albuterol (VENTOLIN HFA) 108 (90 Base) MCG/ACT inhaler    Sig: INHALE 1 TO 2 PUFFS INTO THE LUNGS EVERY 4 HOURS AS NEEDED FOR WHEEZING OR SHORTNESS OF BREATH    Dispense:  8.5 each    Refill:  2   Patient Instructions   Take Symbicort daily to lessen the need for rescue inhaler.  Okay to use the rescue inhaler albuterol if needed for wheezing or shortness of breath.  Recheck with me in 3 months, sooner if new or worsening symptoms.  If Symbicort is too expensive, let me know and we can look at other options.   Return to the clinic or go to the nearest emergency room if any of your symptoms worsen or new symptoms occur.   http://www.aaaai.org/conditions-and-treatments/asthma">  Asthma, Adult  Asthma is a long-term (chronic) condition that causes recurrent episodes in which the airways become tight and narrow. The airways are the passages that lead from the nose and mouth down into the lungs. Asthma episodes, also called asthma attacks, can cause coughing, wheezing, shortness of breath, and chest pain. The airways can also fill with mucus. During an attack, it can be difficult to breathe. Asthma attacks can range from minor to life threatening. Asthma cannot be cured, but medicines and lifestyle changes can help control it and treat acute attacks. What are the causes? This condition is believed to be caused by inherited (genetic) and environmental factors, but its exact cause is not known. There are many things that can bring on an asthma attack or make asthma symptoms worse (triggers). Asthma triggers are different for each person. Common  triggers include:  Mold.  Dust.  Cigarette smoke.  Cockroaches.  Things that can cause allergy symptoms (allergens), such as animal dander or pollen from trees or grass.  Air pollutants such as household cleaners, wood smoke, smog, or [employer] account planner.  Cold air, weather changes, and winds (which increase molds and pollen in the air).  Strong emotional expressions such as crying or laughing hard.  Stress.  Certain medicines (such as aspirin) or types of medicines (such as beta-blockers).  Sulfites in foods and drinks. Foods and drinks that may contain sulfites include dried fruit, potato chips, and sparkling grape juice.  Infections or inflammatory conditions such as the flu, a cold, or inflammation of the nasal membranes (  rhinitis).  Gastroesophageal reflux disease (GERD).  Exercise or strenuous activity. What are the signs or symptoms? Symptoms of this condition may occur right after asthma is triggered or many hours later. Symptoms include:  Wheezing. This can sound like whistling when you breathe.  Excessive nighttime or early morning coughing.  Frequent or severe coughing with a common cold.  Chest tightness.  Shortness of breath.  Tiredness (fatigue) with minimal activity. How is this diagnosed? This condition is diagnosed based on:  Your medical history.  A physical exam.  Tests, which may include: ? Lung function studies and pulmonary studies (spirometry). These tests can evaluate the flow of air in your lungs. ? Allergy tests. ? Imaging tests, such as X-rays. How is this treated? There is no cure for this condition, but treatment can help control your symptoms. Treatment for asthma usually involves:  Identifying and avoiding your asthma triggers.  Using medicines to control your symptoms. Generally, two types of medicines are used to treat asthma: ? Controller medicines. These help prevent asthma symptoms from occurring. They are usually taken every  day. ? Fast-acting reliever or rescue medicines. These quickly relieve asthma symptoms by widening the narrow and tight airways. They are used as needed and provide short-term relief.  Using supplemental oxygen. This may be needed during a severe episode.  Using other medicines, such as: ? Allergy medicines, such as antihistamines, if your asthma attacks are triggered by allergens. ? Immune medicines (immunomodulators). These are medicines that help control the immune system.  Creating an asthma action plan. An asthma action plan is a written plan for managing and treating your asthma attacks. This plan includes: ? A list of your asthma triggers and how to avoid them. ? Information about when medicines should be taken and when their dosage should be changed. ? Instructions about using a device called a peak flow meter. A peak flow meter measures how well the lungs are working and the severity of your asthma. It helps you monitor your condition. Follow these instructions at home: Controlling your home environment Control your home environment in the following ways to help avoid triggers and prevent asthma attacks:  Change your heating and air conditioning filter regularly.  Limit your use of fireplaces and wood stoves.  Get rid of pests (such as roaches and mice) and their droppings.  Throw away plants if you see mold on them.  Clean floors and dust surfaces regularly. Use unscented cleaning products.  Try to have someone else vacuum for you regularly. Stay out of rooms while they are being vacuumed and for a short while afterward. If you vacuum, use a dust mask from a hardware store, a double-layered or microfilter vacuum cleaner bag, or a vacuum cleaner with a HEPA filter.  Replace carpet with wood, tile, or vinyl flooring. Carpet can trap dander and dust.  Use allergy-proof pillows, mattress covers, and box spring covers.  Keep your bedroom a trigger-free room.  Avoid pets and  keep windows closed when allergens are in the air.  Wash beddings every week in hot water and dry them in a dryer.  Use blankets that are made of polyester or cotton.  Clean bathrooms and kitchens with bleach. If possible, have someone repaint the walls in these rooms with mold-resistant paint. Stay out of the rooms that are being cleaned and painted.  Wash your hands often with soap and water. If soap and water are not available, use hand sanitizer.  Do not allow anyone to smoke  in your home. General instructions  Take over-the-counter and prescription medicines only as told by your health care provider. ? Speak with your health care provider if you have questions about how or when to take the medicines. ? Make note if you are requiring more frequent dosages.  Do not use any products that contain nicotine or tobacco, such as cigarettes and e-cigarettes. If you need help quitting, ask your health care provider. Also, avoid being exposed to secondhand smoke.  Use a peak flow meter as told by your health care provider. Record and keep track of the readings.  Understand and use the asthma action plan to help minimize, or stop an asthma attack, without needing to seek medical care.  Make sure you stay up to date on your yearly vaccinations as told by your health care provider. This may include vaccines for the flu and pneumonia.  Avoid outdoor activities when allergen counts are high and when air quality is low.  Wear a ski mask that covers your nose and mouth during outdoor winter activities. Exercise indoors on cold days if you can.  Warm up before exercising, and take time for a cool-down period after exercise.  Keep all follow-up visits as told by your health care provider. This is important. Where to find more information  For information about asthma, turn to the Centers for Disease Control and Prevention at http://www.clark.net/  For air quality information, turn to [employer] at  https://www.miller-reyes.info/ Contact a health care provider if:  You have wheezing, shortness of breath, or a cough even while you are taking medicine to prevent attacks.  The mucus you cough up (sputum) is thicker than usual.  Your sputum changes from clear or white to yellow, green, gray, or bloody.  Your medicines are causing side effects, such as a rash, itching, swelling, or trouble breathing.  You need to use a reliever medicine more than 2-3 times a week.  Your peak flow reading is still at 50-79% of your personal best after following your action plan for 1 hour.  You have a fever. Get help right away if:  You are getting worse and do not respond to treatment during an asthma attack.  You are short of breath when at rest or when doing very little physical activity.  You have difficulty eating, drinking, or talking.  You have chest pain or tightness.  You develop a fast heartbeat or palpitations.  You have a bluish color to your lips or fingernails.  You are light-headed or dizzy, or you faint.  Your peak flow reading is less than 50% of your personal best.  You feel too tired to breathe normally. Summary  Asthma is a long-term (chronic) condition that causes recurrent episodes in which the airways become tight and narrow. These episodes can cause coughing, wheezing, shortness of breath, and chest pain.  Asthma cannot be cured, but medicines and lifestyle changes can help control it and treat acute attacks.  Make sure you understand how to avoid triggers and how and when to use your medicines.  Asthma attacks can range from minor to life threatening. Get help right away if you have an asthma attack and do not respond to treatment with your usual rescue medicines. This information is not intended to replace advice given to you by your health care provider. Make sure you discuss any questions you have with your health care provider. Document Revised: 11/20/2019 Document Reviewed:  06/24/2019 Elsevier Patient Education  2021 [employer].  If you have lab work done today you will be contacted with your lab results within the next 2 weeks.  If you have not heard from [place] then please contact us. The fastest way to get your results is to register for My Chart.   IF you received an x-ray today, you will receive an invoice from [facility]. Please contact [facility] at [phone] with questions or concerns regarding your invoice.   IF you received labwork today, you will receive an invoice from [facility]. Please contact [facility] at [phone] with questions or concerns regarding your invoice.   Our billing staff will not be able to assist you with questions regarding bills from these companies.  You will be contacted with the lab results as soon as they are available. The fastest way to get your results is to activate your My Chart account. Instructions are located on the last page of this paperwork. If you have not heard from [place] regarding the results in 2 weeks, please contact this office.         Signed, Merri Ray, MD [facility]

## 2020-04-21 NOTE — Patient Instructions (Addendum)
Take Symbicort daily to lessen the need for rescue inhaler.  Okay to use the rescue inhaler albuterol if needed for wheezing or shortness of breath.  Recheck with me in 3 months, sooner if new or worsening symptoms.  If Symbicort is too expensive, let me know and we can look at other options.   Return to the clinic or go to the nearest emergency room if any of your symptoms worsen or new symptoms occur.   http://www.aaaai.org/conditions-and-treatments/asthma">  Asthma, Adult  Asthma is a long-term (chronic) condition that causes recurrent episodes in which the airways become tight and narrow. The airways are the passages that lead from the nose and mouth down into the lungs. Asthma episodes, also called asthma attacks, can cause coughing, wheezing, shortness of breath, and chest pain. The airways can also fill with mucus. During an attack, it can be difficult to breathe. Asthma attacks can range from minor to life threatening. Asthma cannot be cured, but medicines and lifestyle changes can help control it and treat acute attacks. What are the causes? This condition is believed to be caused by inherited (genetic) and environmental factors, but its exact cause is not known. There are many things that can bring on an asthma attack or make asthma symptoms worse (triggers). Asthma triggers are different for each person. Common triggers include:  Mold.  Dust.  Cigarette smoke.  Cockroaches.  Things that can cause allergy symptoms (allergens), such as animal dander or pollen from trees or grass.  Air pollutants such as household cleaners, wood smoke, smog, or Ad[employer] account planner.  Cold air, weather changes, and winds (which increase molds and pollen in the air).  Strong emotional expressions such as crying or laughing hard.  Stress.  Certain medicines (such as aspirin) or types of medicines (such as beta-blockers).  Sulfites in foods and drinks. Foods and drinks that may contain sulfites  include dried fruit, potato chips, and sparkling grape juice.  Infections or inflammatory conditions such as the flu, a cold, or inflammation of the nasal membranes (rhinitis).  Gastroesophageal reflux disease (GERD).  Exercise or strenuous activity. What are the signs or symptoms? Symptoms of this condition may occur right after asthma is triggered or many hours later. Symptoms include:  Wheezing. This can sound like whistling when you breathe.  Excessive nighttime or early morning coughing.  Frequent or severe coughing with a common cold.  Chest tightness.  Shortness of breath.  Tiredness (fatigue) with minimal activity. How is this diagnosed? This condition is diagnosed based on:  Your medical history.  A physical exam.  Tests, which may include: ? Lung function studies and pulmonary studies (spirometry). These tests can evaluate the flow of air in your lungs. ? Allergy tests. ? Imaging tests, such as X-rays. How is this treated? There is no cure for this condition, but treatment can help control your symptoms. Treatment for asthma usually involves:  Identifying and avoiding your asthma triggers.  Using medicines to control your symptoms. Generally, two types of medicines are used to treat asthma: ? Controller medicines. These help prevent asthma symptoms from occurring. They are usually taken every day. ? Fast-acting reliever or rescue medicines. These quickly relieve asthma symptoms by widening the narrow and tight airways. They are used as needed and provide short-term relief.  Using supplemental oxygen. This may be needed during a severe episode.  Using other medicines, such as: ? Allergy medicines, such as antihistamines, if your asthma attacks are triggered by allergens. ? Immune medicines (immunomodulators). These are  medicines that help control the immune system.  Creating an asthma action plan. An asthma action plan is a written plan for managing and  treating your asthma attacks. This plan includes: ? A list of your asthma triggers and how to avoid them. ? Information about when medicines should be taken and when their dosage should be changed. ? Instructions about using a device called a peak flow meter. A peak flow meter measures how well the lungs are working and the severity of your asthma. It helps you monitor your condition. Follow these instructions at home: Controlling your home environment Control your home environment in the following ways to help avoid triggers and prevent asthma attacks:  Change your heating and air conditioning filter regularly.  Limit your use of fireplaces and wood stoves.  Get rid of pests (such as roaches and mice) and their droppings.  Throw away plants if you see mold on them.  Clean floors and dust surfaces regularly. Use unscented cleaning products.  Try to have someone else vacuum for you regularly. Stay out of rooms while they are being vacuumed and for a short while afterward. If you vacuum, use a dust mask from a hardware store, a double-layered or microfilter vacuum cleaner bag, or a vacuum cleaner with a HEPA filter.  Replace carpet with wood, tile, or vinyl flooring. Carpet can trap dander and dust.  Use allergy-proof pillows, mattress covers, and box spring covers.  Keep your bedroom a trigger-free room.  Avoid pets and keep windows closed when allergens are in the air.  Wash beddings every week in hot water and dry them in a dryer.  Use blankets that are made of polyester or cotton.  Clean bathrooms and kitchens with bleach. If possible, have someone repaint the walls in these rooms with mold-resistant paint. Stay out of the rooms that are being cleaned and painted.  Wash your hands often with soap and water. If soap and water are not available, use hand sanitizer.  Do not allow anyone to smoke in your home. General instructions  Take over-the-counter and prescription medicines  only as told by your health care provider. ? Speak with your health care provider if you have questions about how or when to take the medicines. ? Make note if you are requiring more frequent dosages.  Do not use any products that contain nicotine or tobacco, such as cigarettes and e-cigarettes. If you need help quitting, ask your health care provider. Also, avoid being exposed to secondhand smoke.  Use a peak flow meter as told by your health care provider. Record and keep track of the readings.  Understand and use the asthma action plan to help minimize, or stop an asthma attack, without needing to seek medical care.  Make sure you stay up to date on your yearly vaccinations as told by your health care provider. This may include vaccines for the flu and pneumonia.  Avoid outdoor activities when allergen counts are high and when air quality is low.  Wear a ski mask that covers your nose and mouth during outdoor winter activities. Exercise indoors on cold days if you can.  Warm up before exercising, and take time for a cool-down period after exercise.  Keep all follow-up visits as told by your health care provider. This is important. Where to find more information  For information about asthma, turn to the Centers for Disease Control and Prevention at http://www.clark.net/  For air quality information, turn to [employer] at https://www.miller-reyes.info/ Contact a health  care provider if:  You have wheezing, shortness of breath, or a cough even while you are taking medicine to prevent attacks.  The mucus you cough up (sputum) is thicker than usual.  Your sputum changes from clear or white to yellow, green, gray, or bloody.  Your medicines are causing side effects, such as a rash, itching, swelling, or trouble breathing.  You need to use a reliever medicine more than 2-3 times a week.  Your peak flow reading is still at 50-79% of your personal best after following your action plan for 1 hour.  You  have a fever. Get help right away if:  You are getting worse and do not respond to treatment during an asthma attack.  You are short of breath when at rest or when doing very little physical activity.  You have difficulty eating, drinking, or talking.  You have chest pain or tightness.  You develop a fast heartbeat or palpitations.  You have a bluish color to your lips or fingernails.  You are light-headed or dizzy, or you faint.  Your peak flow reading is less than 50% of your personal best.  You feel too tired to breathe normally. Summary  Asthma is a long-term (chronic) condition that causes recurrent episodes in which the airways become tight and narrow. These episodes can cause coughing, wheezing, shortness of breath, and chest pain.  Asthma cannot be cured, but medicines and lifestyle changes can help control it and treat acute attacks.  Make sure you understand how to avoid triggers and how and when to use your medicines.  Asthma attacks can range from minor to life threatening. Get help right away if you have an asthma attack and do not respond to treatment with your usual rescue medicines. This information is not intended to replace advice given to you by your health care provider. Make sure you discuss any questions you have with your health care provider. Document Revised: 11/20/2019 Document Reviewed: 06/24/2019 Elsevier Patient Education  2021 [employer].    If you have lab work done today you will be contacted with your lab results within the next 2 weeks.  If you have not heard from [place] then please contact us. The fastest way to get your results is to register for My Chart.   IF you received an x-ray today, you will receive an invoice from [facility]. Please contact [facility] at [phone] with questions or concerns regarding your invoice.   IF you received labwork today, you will receive an invoice from [facility]. Please contact [facility]  at [phone] with questions or concerns regarding your invoice.   Our billing staff will not be able to assist you with questions regarding bills from these companies.  You will be contacted with the lab results as soon as they are available. The fastest way to get your results is to activate your My Chart account. Instructions are located on the last page of this paperwork. If you have not heard from [place] regarding the results in 2 weeks, please contact this office.

## 2020-05-01 ENCOUNTER — Emergency Department (HOSPITAL_COMMUNITY)
Admission: EM | Admit: 2020-05-01 | Discharge: 2020-05-01 | Disposition: A | Discharge status: Home / Self Care | Payer: BC Managed Care – PPO | Attending: Emergency Medicine | Admitting: Emergency Medicine

## 2020-05-01 ENCOUNTER — Other Ambulatory Visit: Payer: Self-pay

## 2020-05-01 ENCOUNTER — Encounter (HOSPITAL_COMMUNITY): Payer: Self-pay

## 2020-05-01 DIAGNOSIS — T17208A Unspecified foreign body in pharynx causing other injury, initial encounter: Secondary | ICD-10-CM

## 2020-05-01 DIAGNOSIS — R042 Hemoptysis: Secondary | ICD-10-CM | POA: Insufficient documentation

## 2020-05-01 DIAGNOSIS — R000 Tachycardia, unspecified: Secondary | ICD-10-CM | POA: Diagnosis not present

## 2020-05-01 DIAGNOSIS — J45909 Unspecified asthma, uncomplicated: Secondary | ICD-10-CM | POA: Insufficient documentation

## 2020-05-01 LAB — COMPREHENSIVE METABOLIC PANEL
ALT: 24 U/L (ref 0–44)
AST: 18 U/L (ref 15–41)
Albumin: 4 g/dL (ref 3.5–5.0)
Alkaline Phosphatase: 58 U/L (ref 38–126)
Anion gap: 8 (ref 5–15)
BUN: 19 mg/dL (ref 6–20)
CO2: 25 mmol/L (ref 22–32)
Calcium: 8.9 mg/dL (ref 8.9–10.3)
Chloride: 106 mmol/L (ref 98–111)
Creatinine, Ser: 0.77 mg/dL (ref 0.61–1.24)
GFR, Estimated: >60 mL/min (ref >60)
Glucose, Bld: 239 mg/dL — ABNORMAL HIGH (ref 70–99)
Potassium: 3.4 mmol/L — ABNORMAL LOW (ref 3.5–5.1)
Sodium: 139 mmol/L (ref 135–145)
Total Bilirubin: 0.7 mg/dL (ref 0.3–1.2)
Total Protein: 8 g/dL (ref 6.5–8.1)

## 2020-05-01 LAB — CBC
HCT: 41.5 % (ref 39.0–52.0)
Hemoglobin: 13.5 g/dL (ref 13.0–17.0)
MCH: 26.3 pg (ref 26.0–34.0)
MCHC: 32.5 g/dL (ref 30.0–36.0)
MCV: 80.7 fL (ref 80.0–100.0)
Platelets: 243 10*3/uL (ref 150–400)
RBC: 5.14 MIL/uL (ref 4.22–5.81)
RDW: 13.5 % (ref 11.5–15.5)
WBC: 9.9 10*3/uL (ref 4.0–10.5)
nRBC: 0 % (ref 0.0–0.2)

## 2020-05-01 LAB — TYPE AND SCREEN
ABO/RH(D): A POS
Antibody Screen: NEGATIVE

## 2020-05-01 MED ORDER — SODIUM CHLORIDE 0.9 % IV BOLUS
1000.0000 mL | Freq: Once | INTRAVENOUS | Status: AC
  Administered 2020-05-01: 1000 mL via INTRAVENOUS

## 2020-05-01 MED ORDER — ALBUTEROL SULFATE HFA 108 (90 BASE) MCG/ACT IN AERS
4.0000 | INHALATION_SPRAY | Freq: Once | RESPIRATORY_TRACT | Status: AC
  Administered 2020-05-01: 4 via RESPIRATORY_TRACT
  Filled 2020-05-01: qty 6.7

## 2020-05-01 MED ORDER — RACEPINEPHRINE HCL 2.25 % IN NEBU
0.5000 mL | INHALATION_SOLUTION | Freq: Once | RESPIRATORY_TRACT | Status: DC
  Filled 2020-05-01: qty 0.5

## 2020-05-01 MED ORDER — IPRATROPIUM BROMIDE HFA 17 MCG/ACT IN AERS
2.0000 | INHALATION_SPRAY | Freq: Once | RESPIRATORY_TRACT | Status: AC
  Administered 2020-05-01: 2 via RESPIRATORY_TRACT
  Filled 2020-05-01: qty 12.9

## 2020-05-01 MED ORDER — RACEPINEPHRINE HCL 2.25 % IN NEBU
INHALATION_SOLUTION | RESPIRATORY_TRACT | Status: AC
  Administered 2020-05-01: 0.5 mL
  Filled 2020-05-01: qty 0.5

## 2020-05-01 NOTE — ED Triage Notes (Addendum)
Patient coughing/spitting up bright red blood since yesterday.  Patient reports getting a fish bone stuck in his throat 1 week ago and it came out yesterday prior to bleeding starting.  Emesis bag from EMS contains 200 ml bright red blood, patient reports bleeding started yesterday around 1000

## 2020-05-01 NOTE — ED Notes (Signed)
Patient gargle with ice water x 3

## 2020-05-01 NOTE — Progress Notes (Addendum)
The nurse called and said the doctor wanted a racemic given to the pt due to bleeding. RT explained to the nurse that the Pt needed to at least get a rapid COVID  test and ask the doctor to order one. The RN stated that the doctor said to start the treatment and leave the room. I explained to the nurse that I would come and do the Tx. But the Pt still needs a COVID test. I dont see an order for a COVID test. The test is to keep the Pt, family and other Pt's safe along with staff. RT will continue to monitor

## 2020-05-01 NOTE — Discharge Instructions (Addendum)
It was our pleasure to provide your ER care today - we hope that you feel better.  Follow up with ENT doctor tomorrow - see attached information - call office tomorrow AM at 8 AM to arrange appointment time.   Return to ER if worse, new symptoms, trouble breathing or swallowing, increased or heavy bleeding, weak/faint, new or severe pain, or other concern.

## 2020-05-01 NOTE — ED Provider Notes (Signed)
[facility] DEPT Provider Note   CSN: [id number] Arrival date & time: 05/01/20  0732     History Chief Complaint  Patient presents with  . Hemoptysis    Matthew Brooks is a 45 y.o. male.  Patient w hx asthma indicates one week ago had fish bone get stuck in throat (points to mid neck on left side anteriorly). States every time he would eat/drink he would feel it stuck there. Yesterday feels like he coughed it out (but did not actually see the bone), and then had onset periodic spitting out blood. Symptoms acute onset, moderate, persistent/recurrent. Notes increased amount of spitting up/out blood today ~ 200 cc bright red blood in bag. No anticoagulant use. No other abnormal bruising or bleeding. Denies other recent sore throat, cough or uri symptoms. No fever or chills. No faintness or dizziness. Denies throat swelling or closing sensation and states he feels like he is breathing fine. No neck pain currently.   The history is provided by the patient and a relative.       Past Medical History:  Diagnosis Date  . Asthma     Patient Active Problem List   Diagnosis Date Noted  . Requires typhoid vaccination 08/16/2017  . Need for malaria prophylaxis 08/16/2017  . Asthma 05/31/2015  . Eczema 05/31/2015    History reviewed. No pertinent surgical history.     History reviewed. No pertinent family history.  Social History   Tobacco Use  . Smoking status: Never Smoker  . Smokeless tobacco: Never Used  Substance Use Topics  . Alcohol use: No  . Drug use: No    Home Medications Prior to Admission medications   Medication Sig Start Date End Date Taking? Authorizing Provider  albuterol (VENTOLIN HFA) 108 (90 Base) MCG/ACT inhaler INHALE 1 TO 2 PUFFS INTO THE LUNGS EVERY 4 HOURS AS NEEDED FOR WHEEZING OR SHORTNESS OF BREATH 04/21/20   Wendie Agreste, MD  budesonide-formoterol [facility]) 80-4.5 MCG/ACT inhaler Inhale 2 puffs into the lungs 2 (two)  times daily. 04/21/20   Wendie Agreste, MD    Allergies    Augmentin [amoxicillin-pot clavulanate]  Review of Systems   Review of Systems  Constitutional: Negative for fever.  HENT: Negative for nosebleeds and sore throat.   Eyes: Negative for redness.  Respiratory: Negative for shortness of breath.   Cardiovascular: Negative for chest pain.  Gastrointestinal: Negative for abdominal pain, blood in stool and vomiting.  Genitourinary: Negative for flank pain and hematuria.  Musculoskeletal: Negative for back pain and neck pain.  Skin: Negative for rash.  Neurological: Negative for dizziness and light-headedness.  Hematological: Does not bruise/bleed easily.  Psychiatric/Behavioral: Negative for confusion.    Physical Exam Updated Vital Signs BP 119/82   Pulse (!) 132   Temp 97.7 F (36.5 C) (Oral)   Resp 20   Ht 1.651 m (5\' 5" )   Wt 75 kg   SpO2 97%   BMI 27.51 kg/m   Physical Exam Vitals and nursing note reviewed.  Constitutional:      Appearance: Normal appearance. He is well-developed.  HENT:     Head: Atraumatic.     Nose: Nose normal.     Mouth/Throat:     Mouth: Mucous membranes are moist.     Pharynx: Oropharynx is clear.     Comments: Red blood in mouth, on tongue, but no site of active bleeding able to be visualized.  Eyes:     General: No scleral icterus.  Conjunctiva/sclera: Conjunctivae normal.  Neck:     Trachea: No tracheal deviation.     Comments: No neck mass. Trachea midline.  Cardiovascular:     Rate and Rhythm: Regular rhythm. Tachycardia present.     Pulses: Normal pulses.     Heart sounds: Normal heart sounds. No murmur heard. No friction rub. No gallop.   Pulmonary:     Effort: Pulmonary effort is normal. No accessory muscle usage or respiratory distress.     Breath sounds: No stridor. Wheezing present.  Abdominal:     General: Bowel sounds are normal. There is no distension.     Palpations: Abdomen is soft.     Tenderness: There  is no abdominal tenderness.  Genitourinary:    Comments: No cva tenderness. Musculoskeletal:        General: No swelling.     Cervical back: Normal range of motion and neck supple. No rigidity.  Skin:    General: Skin is warm and dry.     Findings: No rash.  Neurological:     Mental Status: He is alert.     Comments: Alert, speech clear.   Psychiatric:        Mood and Affect: Mood normal.     ED Results / Procedures / Treatments   Labs (all labs ordered are listed, but only abnormal results are displayed) Results for orders placed or performed during the hospital encounter of 05/01/20  Comprehensive metabolic panel  Result Value Ref Range   Sodium 139 135 - 145 mmol/L   Potassium 3.4 (L) 3.5 - 5.1 mmol/L   Chloride 106 98 - 111 mmol/L   CO2 25 22 - 32 mmol/L   Glucose, Bld 239 (H) 70 - 99 mg/dL   BUN 19 6 - 20 mg/dL   Creatinine, Ser 0.77 0.61 - 1.24 mg/dL   Calcium 8.9 8.9 - 10.3 mg/dL   Total Protein 8.0 6.5 - 8.1 g/dL   Albumin 4.0 3.5 - 5.0 g/dL   AST 18 15 - 41 U/L   ALT 24 0 - 44 U/L   Alkaline Phosphatase 58 38 - 126 U/L   Total Bilirubin 0.7 0.3 - 1.2 mg/dL   GFR, Estimated >60 >60 mL/min   Anion gap 8 5 - 15  CBC  Result Value Ref Range   WBC 9.9 4.0 - 10.5 K/uL   RBC 5.14 4.22 - 5.81 MIL/uL   Hemoglobin 13.5 13.0 - 17.0 g/dL   HCT 41.5 39.0 - 52.0 %   MCV 80.7 80.0 - 100.0 fL   MCH 26.3 26.0 - 34.0 pg   MCHC 32.5 30.0 - 36.0 g/dL   RDW 13.5 11.5 - 15.5 %   Platelets 243 150 - 400 K/uL   nRBC 0.0 0.0 - 0.2 %  Type and screen [place]  Result Value Ref Range   ABO/RH(D) A POS    Antibody Screen NEG    Sample Expiration      05/04/2020,2359 Performed at [facility], [place] [address] [place], [place] [address]    EKG None  Radiology No results found.  Procedures Procedures   Medications Ordered in ED Medications  ipratropium (ATROVENT HFA) inhaler 2 puff (has no administration in time range)   albuterol (VENTOLIN HFA) 108 (90 Base) MCG/ACT inhaler 4 puff (has no administration in time range)  sodium chloride 0.9 % bolus 1,000 mL (has no administration in time range)    ED Course  I have reviewed the triage  vital signs and the nursing notes.  Pertinent labs & imaging results that were available during my care of the patient were reviewed by me and considered in my medical decision making (see chart for details).    MDM Rules/Calculators/A&P                         Iv ns bolus. Stat labs.   Reviewed nursing notes and prior charts for additional history.   ENT consulted.   Albuterol and atrovent tx for wheezing/hx asthma.   Labs reviewed/interpreted by me - hgb 13.5.  Discussed pt with ENT, Dr Fredric Dine - she indicates to given racemic epi breathing tx, and do ice water gargles, and if bleeding improves to have f/u in office with her in next 1-2 days.   Racemic epi tx, ice water gargles - on recheck bleeding much improved.   Recheck pt, no recurrent bleeding noted. No coughing, no vomiting. No faintness or dizziness.   Rec f/u ENT tomorrow for recheck, possible scope/NP.  Return precautions provided.     Final Clinical Impression(s) / ED Diagnoses Final diagnoses:  None    Rx / DC Orders ED Discharge Orders    None       Lajean Saver, MD 05/01/20 1224

## 2020-05-01 NOTE — ED Notes (Signed)
Patient drinking Ice water, bleeding has stopped, MD notified.

## 2020-05-01 NOTE — ED Notes (Signed)
Patient given Ice water to drink

## 2020-05-02 ENCOUNTER — Ambulatory Visit (HOSPITAL_COMMUNITY): Payer: BC Managed Care – PPO | Admitting: Certified Registered"

## 2020-05-02 ENCOUNTER — Encounter (HOSPITAL_COMMUNITY): Payer: Self-pay | Admitting: Otolaryngology

## 2020-05-02 ENCOUNTER — Ambulatory Visit (HOSPITAL_COMMUNITY)
Admission: RE | Admit: 2020-05-02 | Discharge: 2020-05-02 | Disposition: A | Discharge status: Home / Self Care | Payer: BC Managed Care – PPO | Source: Other Acute Inpatient Hospital | Attending: Otolaryngology | Admitting: Otolaryngology

## 2020-05-02 ENCOUNTER — Encounter (HOSPITAL_COMMUNITY): Admission: RE | Disposition: A | Discharge status: Home / Self Care | Payer: Self-pay | Attending: Otolaryngology

## 2020-05-02 DIAGNOSIS — J398 Other specified diseases of upper respiratory tract: Secondary | ICD-10-CM | POA: Diagnosis not present

## 2020-05-02 DIAGNOSIS — R042 Hemoptysis: Secondary | ICD-10-CM | POA: Diagnosis not present

## 2020-05-02 DIAGNOSIS — J45909 Unspecified asthma, uncomplicated: Secondary | ICD-10-CM | POA: Diagnosis not present

## 2020-05-02 DIAGNOSIS — T18128A Food in esophagus causing other injury, initial encounter: Secondary | ICD-10-CM | POA: Diagnosis not present

## 2020-05-02 DIAGNOSIS — T17228A Food in pharynx causing other injury, initial encounter: Secondary | ICD-10-CM | POA: Insufficient documentation

## 2020-05-02 DIAGNOSIS — Y9389 Activity, other specified: Secondary | ICD-10-CM | POA: Diagnosis not present

## 2020-05-02 DIAGNOSIS — Z20822 Contact with and (suspected) exposure to covid-19: Secondary | ICD-10-CM | POA: Diagnosis not present

## 2020-05-02 DIAGNOSIS — D105 Benign neoplasm of other parts of oropharynx: Secondary | ICD-10-CM | POA: Insufficient documentation

## 2020-05-02 DIAGNOSIS — T17208A Unspecified foreign body in pharynx causing other injury, initial encounter: Secondary | ICD-10-CM | POA: Diagnosis not present

## 2020-05-02 HISTORY — PX: DIRECT LARYNGOSCOPY: SHX5326

## 2020-05-02 LAB — RESP PANEL BY RT-PCR (FLU A&B, COVID) ARPGX2
Influenza A by PCR: NEGATIVE
Influenza B by PCR: NEGATIVE
SARS Coronavirus 2 by RT PCR: NEGATIVE

## 2020-05-02 SURGERY — LARYNGOSCOPY, DIRECT
Anesthesia: General | Site: Throat

## 2020-05-02 MED ORDER — DEXAMETHASONE SODIUM PHOSPHATE 10 MG/ML IJ SOLN
INTRAMUSCULAR | Status: AC
  Filled 2020-05-02: qty 1

## 2020-05-02 MED ORDER — FENTANYL CITRATE (PF) 100 MCG/2ML IJ SOLN: 25.0000 ug | INTRAMUSCULAR | Status: DC | PRN

## 2020-05-02 MED ORDER — EPINEPHRINE HCL (NASAL) 0.1 % NA SOLN
NASAL | Status: DC | PRN
  Administered 2020-05-02: 1 mL via TOPICAL

## 2020-05-02 MED ORDER — LACTATED RINGERS IV SOLN: INTRAVENOUS | Status: DC

## 2020-05-02 MED ORDER — PROPOFOL 10 MG/ML IV BOLUS
INTRAVENOUS | Status: DC | PRN
  Administered 2020-05-02: 50 mg via INTRAVENOUS
  Administered 2020-05-02: 200 mg via INTRAVENOUS

## 2020-05-02 MED ORDER — MIDAZOLAM HCL 2 MG/2ML IJ SOLN
INTRAMUSCULAR | Status: AC
  Filled 2020-05-02: qty 2

## 2020-05-02 MED ORDER — ESMOLOL HCL 100 MG/10ML IV SOLN
INTRAVENOUS | Status: DC | PRN
  Administered 2020-05-02: 20 mg via INTRAVENOUS
  Administered 2020-05-02: 30 mg via INTRAVENOUS

## 2020-05-02 MED ORDER — PROPOFOL 10 MG/ML IV BOLUS
INTRAVENOUS | Status: AC
  Filled 2020-05-02: qty 20

## 2020-05-02 MED ORDER — ARTIFICIAL TEARS OPHTHALMIC OINT
TOPICAL_OINTMENT | OPHTHALMIC | Status: AC
  Filled 2020-05-02: qty 3.5

## 2020-05-02 MED ORDER — ONDANSETRON HCL 4 MG/2ML IJ SOLN
INTRAMUSCULAR | Status: AC
  Filled 2020-05-02: qty 2

## 2020-05-02 MED ORDER — SUCCINYLCHOLINE CHLORIDE 200 MG/10ML IV SOSY
PREFILLED_SYRINGE | INTRAVENOUS | Status: AC
  Filled 2020-05-02: qty 10

## 2020-05-02 MED ORDER — IPRATROPIUM-ALBUTEROL 0.5-2.5 (3) MG/3ML IN SOLN
RESPIRATORY_TRACT | Status: AC
  Filled 2020-05-02: qty 3

## 2020-05-02 MED ORDER — IPRATROPIUM-ALBUTEROL 0.5-2.5 (3) MG/3ML IN SOLN
3.0000 mL | Freq: Once | RESPIRATORY_TRACT | Status: AC
  Administered 2020-05-02: 3 mL via RESPIRATORY_TRACT

## 2020-05-02 MED ORDER — LIDOCAINE 2% (20 MG/ML) 5 ML SYRINGE
INTRAMUSCULAR | Status: AC
  Filled 2020-05-02: qty 5

## 2020-05-02 MED ORDER — DEXAMETHASONE SODIUM PHOSPHATE 10 MG/ML IJ SOLN
INTRAMUSCULAR | Status: DC | PRN
  Administered 2020-05-02: 10 mg via INTRAVENOUS

## 2020-05-02 MED ORDER — ONDANSETRON HCL 4 MG/2ML IJ SOLN
INTRAMUSCULAR | Status: DC | PRN
  Administered 2020-05-02: 4 mg via INTRAVENOUS

## 2020-05-02 MED ORDER — MIDAZOLAM HCL 2 MG/2ML IJ SOLN
INTRAMUSCULAR | Status: DC | PRN
  Administered 2020-05-02: 2 mg via INTRAVENOUS

## 2020-05-02 MED ORDER — STERILE WATER FOR IRRIGATION IR SOLN
Status: DC | PRN
  Administered 2020-05-02: 1000 mL

## 2020-05-02 MED ORDER — EPINEPHRINE PF 1 MG/ML IJ SOLN
INTRAMUSCULAR | Status: AC
  Filled 2020-05-02: qty 1

## 2020-05-02 MED ORDER — LIDOCAINE 2% (20 MG/ML) 5 ML SYRINGE
INTRAMUSCULAR | Status: DC | PRN
  Administered 2020-05-02: 60 mg via INTRAVENOUS

## 2020-05-02 SURGICAL SUPPLY — 17 items
CANISTER SUCT 3000ML PPV (MISCELLANEOUS) ×2 IMPLANT
COVER BACK TABLE 60X90IN (DRAPES) ×2 IMPLANT
COVER MAYO STAND STRL (DRAPES) ×2 IMPLANT
DRAPE HALF SHEET 40X57 (DRAPES) ×2 IMPLANT
GAUZE 4X4 16PLY RFD (DISPOSABLE) ×1 IMPLANT
GLOVE ECLIPSE 7.5 STRL STRAW (GLOVE) ×2 IMPLANT
GUARD TEETH (MISCELLANEOUS) ×2 IMPLANT
KIT BASIN OR (CUSTOM PROCEDURE TRAY) ×2 IMPLANT
KIT TURNOVER KIT B (KITS) ×2 IMPLANT
PAD ARMBOARD 7.5X6 YLW CONV (MISCELLANEOUS) ×4 IMPLANT
PATTIES SURGICAL .5 X3 (DISPOSABLE) ×1 IMPLANT
SLEEVE SCD COMPRESS KNEE MED (STOCKING) ×1 IMPLANT
SOL ANTI FOG 6CC (MISCELLANEOUS) IMPLANT
SOLUTION ANTI FOG 6CC (MISCELLANEOUS) ×1
TOWEL GREEN STERILE (TOWEL DISPOSABLE) ×2 IMPLANT
TUBE CONNECTING 12X1/4 (SUCTIONS) ×2 IMPLANT
WATER STERILE IRR 1000ML POUR (IV SOLUTION) ×1 IMPLANT

## 2020-05-02 NOTE — Discharge Instructions (Signed)
Start with liquids and work your way up to a soft diet as tolerated.  You may take Tylenol and/or Motrin for the pain.  If the pain does not start to subside within 48 hours please contact Dr. Janeice Robinson office for another evaluation.

## 2020-05-02 NOTE — Anesthesia Preprocedure Evaluation (Addendum)
Anesthesia Evaluation  Patient identified by MRN, date of birth, ID band Patient awake    Reviewed: Allergy & Precautions, NPO status , Patient's Chart, lab work & pertinent test results  Airway Mallampati: III  TM Distance: >3 FB Neck ROM: Full    Dental  (+) Poor Dentition, Dental Advisory Given   Pulmonary    Pulmonary exam normal        Cardiovascular negative cardio ROS Normal cardiovascular exam     Neuro/Psych    GI/Hepatic Neg liver ROS, History noted CG   Endo/Other  negative endocrine ROS  Renal/GU negative Renal ROS     Musculoskeletal   Abdominal   Peds  Hematology   Anesthesia Other Findings   Reproductive/Obstetrics                            Anesthesia Physical Anesthesia Plan  ASA: I and emergent  Anesthesia Plan: General   Post-op Pain Management:    Induction: Intravenous and Rapid sequence  PONV Risk Score and Plan: 3 and Dexamethasone, Ondansetron, Midazolam and Treatment may vary due to age or medical condition  Airway Management Planned: Oral ETT  Additional Equipment:   Intra-op Plan:   Post-operative Plan: Extubation in OR  Informed Consent: I have reviewed the patients History and Physical, chart, labs and discussed the procedure including the risks, benefits and alternatives for the proposed anesthesia with the patient or authorized representative who has indicated his/her understanding and acceptance.     Dental advisory given  Plan Discussed with: CRNA and Anesthesiologist  Anesthesia Plan Comments:        Anesthesia Quick Evaluation

## 2020-05-02 NOTE — H&P (Signed)
HPI:   Matthew Brooks is a 44 y.o. male who presents as a new Patient.   Referring Provider: Self, A Referral  Chief complaint: Spitting up blood.  HPI: About 1 week ago he was eating fish and felt as though he got a bone stuck in his throat. It hurt a little bit for the whole time until yesterday when he spit up some blood and then it hurt more. Otherwise in good health. He has not eaten or drinking anything since early this morning. It is too painful to swallow. History was provided using an interpreter service for [place] language.  PMH/Meds/All/SocHx/FamHx/ROS:   History reviewed. No pertinent past medical history.  Past Surgical History:  Procedure Laterality Date  . NO PAST SURGERIES   No family history of bleeding disorders, wound healing problems or difficulty with anesthesia.   Social History   Socioeconomic History  . Marital status: Unknown  Spouse name: Not on file  . Number of children: Not on file  . Years of education: Not on file  . Highest education level: Not on file  Occupational History  . Not on file  Tobacco Use  . Smoking status: Never Smoker  . Smokeless tobacco: Never Used  Vaping Use  . Vaping Use: Never used  Substance and Sexual Activity  . Alcohol use: Not on file  . Drug use: Not on file  . Sexual activity: Not on file  Other Topics Concern  . Not on file  Social History Narrative  . Not on file   Social Determinants of Health   Financial Resource Strain: Not on file  Food Insecurity: Not on file  Transportation Needs: Not on file  Physical Activity: Not on file  Stress: Not on file  Social Connections: Not on file  Housing Stability: Not on file   Current Outpatient Medications:  . albuterol 90 mcg/actuation inhaler, INHALE 1-2 PUFFS INTO THE LUNGS EVERY 4 HOURS AS NEEDED FOR WHEEZING/SHORTNESS OF BREATH, Disp: , Rfl:  . naproxen-diphenhydramine (ALEVE PM) 220-25 mg Tab, Take by mouth., Disp: , Rfl:  . SYMBICORT 80-4.5  mcg/actuation inhaler, , Disp: , Rfl:   A complete ROS was performed with pertinent positives/negatives noted in the HPI. The remainder of the ROS are negative.   Physical Exam:   BP 135/76  Pulse 94  Temp 97.7 F (36.5 C)  Ht 1.676 m (5\' 6" )  Wt 71.2 kg (157 lb)  BMI 25.34 kg/m   General: Healthy and alert, in no distress, breathing easily. Normal affect. In a pleasant mood. Head: Normocephalic, atraumatic. No masses, or scars. Eyes: Pupils are equal, and reactive to light. Vision is grossly intact. No spontaneous or gaze nystagmus. Ears: Ear canals are clear. Tympanic membranes are intact, with normal landmarks and the middle ears are clear and healthy. Hearing: Grossly normal. Nose: Nasal cavities are clear with healthy mucosa, no polyps or exudate. Airways are patent. Face: No masses or scars, facial nerve function is symmetric. Oral Cavity: No mucosal abnormalities are noted. Tongue with normal mobility. Dentition appears healthy. Oropharynx: Tonsils are symmetric. There are no mucosal masses identified. Tongue base appears normal and healthy. Larynx/Hypopharynx: Inadequate examination with mirror. Chest: Deferred Neck: No palpable masses, no cervical adenopathy, no thyroid nodules or enlargement. Neuro: Cranial nerves II-XII with normal function. Balance: Normal gate. Other findings: none.  Independent Review of Additional Tests or Records:  none  Procedures:  Procedure note: Flexible fiberoptic laryngoscopy  Details of the procedure were explained to the patient and all  questions were answered.   Procedure:   After anesthetizing the nasal cavity with topical lidocaine and oxymetazoline, the flexible endoscope was introduced and passed through the nasal cavity into the nasopharynx. The scope was then advanced to the level of the oropharynx, then the hypopharynx and larynx.   Findings:   Right nasal cavity and nasopharynx are clear to inspection. In the oropharynx  there is some white exudate along the left posterior tongue base. Adjacent to that there is what appears to be a fishbone protruding from the lower portion of the palate teen tonsil. The larynx itself is normal with normal cord mobility.  The scope was withdrawn from the nose. He tolerated the procedure well.   Impression & Plans:  Foreign body in the left side oropharynx, appears to be a fishbone. Recommend urgent direct laryngoscopy with retrieval of foreign body in the operating room under general anesthesia at [facility]. We will schedule for later today.

## 2020-05-02 NOTE — Anesthesia Postprocedure Evaluation (Signed)
Anesthesia Post Note  Patient: [name]  Procedure(s) Performed: DIRECT LARYNGOSCOPY WITH FOREIGN BODY REMOVAL (N/A Throat)     Patient location during evaluation: PACU Anesthesia Type: General Level of consciousness: awake and alert Pain management: pain level controlled Vital Signs Assessment: post-procedure vital signs reviewed and stable Respiratory status: spontaneous breathing, nonlabored ventilation and respiratory function stable Cardiovascular status: blood pressure returned to baseline and stable Postop Assessment: no apparent nausea or vomiting Anesthetic complications: no   No complications documented.  Last Vitals:  Vitals:   05/02/20 2015 05/02/20 2030  BP: (!) 144/92 (!) 143/98  Pulse: 99 100  Resp: 18 18  Temp:    SpO2: 96% 93%    Last Pain:  Vitals:   05/02/20 2015  TempSrc:   PainSc: 0-No pain                 Matthew Brooks

## 2020-05-02 NOTE — Transfer of Care (Signed)
Immediate Anesthesia Transfer of Care Note  Patient: Matthew Brooks  Procedure(s) Performed: DIRECT LARYNGOSCOPY WITH FOREIGN BODY REMOVAL (N/A Throat)  Patient Location: PACU  Anesthesia Type:General  Level of Consciousness: awake, alert  and oriented  Airway & Oxygen Therapy: Patient Spontanous Breathing and Patient connected to face mask oxygen  Post-op Assessment: Report given to RN and Post -op Vital signs reviewed and stable  Post vital signs: Reviewed and stable  Last Vitals:  Vitals Value Taken Time  BP 125/85 05/02/20 1946  Temp    Pulse 94 05/02/20 1950  Resp 11 05/02/20 1950  SpO2 100 % 05/02/20 1950  Vitals shown include unvalidated device data.  Last Pain:  Vitals:   05/02/20 1718  TempSrc: Oral  PainSc:          Complications: No complications documented.

## 2020-05-02 NOTE — Anesthesia Procedure Notes (Signed)
Procedure Name: Intubation Date/Time: 05/02/2020 7:12 PM Performed by: Alain Marion, CRNA Pre-anesthesia Checklist: Patient identified, Emergency Drugs available, Suction available and Patient being monitored Patient Re-evaluated:Patient Re-evaluated prior to induction Oxygen Delivery Method: Circle System Utilized Preoxygenation: Pre-oxygenation with 100% oxygen Induction Type: IV induction Laryngoscope Size: Miller and 2 Grade View: Grade I Tube type: Oral Tube size: 7.0 mm Number of attempts: 1 Airway Equipment and Method: Stylet and Oral airway Placement Confirmation: ETT inserted through vocal cords under direct vision,  positive ETCO2 and breath sounds checked- equal and bilateral Secured at: 22 cm Tube secured with: Tape Dental Injury: Teeth and Oropharynx as per pre-operative assessment

## 2020-05-02 NOTE — Op Note (Signed)
OPERATIVE REPORT  DATE OF SURGERY: 05/02/2020  PATIENT:  Matthew Brooks,  45 y.o. male  PRE-OPERATIVE DIAGNOSIS:  Foreign Body, pharynx  POST-OPERATIVE DIAGNOSIS:  Foreign Body, pharynx  PROCEDURE:  Procedure(s): DIRECT LARYNGOSCOPY WITH ATTEMPTED FOREIGN BODY REMOVAL, biopsy  SURGEON:  Beckie Salts, MD  ASSISTANTS: none  ANESTHESIA:   General   EBL: 10 ml  DRAINS: none  LOCAL MEDICATIONS USED:  None  SPECIMEN: Left vallecula biopsy  COUNTS:  Correct  PROCEDURE DETAILS: The patient was taken to the operating room and placed on the operating table in the supine position. Following induction of general endotracheal anesthesia, the table was turned 90 and the patient was draped in a standard fashion.  Upper and lower tooth protectors were used throughout the procedure.  First a Jackson sliding laryngoscope and then a Jako laryngoscope were used to evaluate the larynx and pharynx.  The foreign body that had been seen in the office examination was not visualized.  There was some granulation tissue and inflamed mucosa and lymphoid tissue around the left side of the vallecula and the glossopharyngeal fold.  The tonsil itself looked normal.  After extensive searching for the bony foreign object I decided to take a biopsy just to make sure there is no neoplasm.  This was sent for pathologic evaluation.  Digital palpation of the area also failed to reveal a bony foreign object.  At this point I decided that it is possible that the bone may have dislodged during the intubation.  The scope and tooth guards were removed and the patient was awakened extubated and transferred to recovery in stable condition.    PATIENT DISPOSITION:  To PACU, stable

## 2020-05-03 ENCOUNTER — Encounter (HOSPITAL_COMMUNITY): Payer: Self-pay | Admitting: Otolaryngology

## 2020-05-09 LAB — SURGICAL PATHOLOGY

## 2020-07-19 ENCOUNTER — Other Ambulatory Visit: Payer: Self-pay | Admitting: Family Medicine

## 2020-07-19 DIAGNOSIS — J45909 Unspecified asthma, uncomplicated: Secondary | ICD-10-CM

## 2020-07-21 ENCOUNTER — Ambulatory Visit: Payer: BC Managed Care – PPO | Admitting: Family Medicine

## 2020-08-10 ENCOUNTER — Other Ambulatory Visit: Payer: Self-pay

## 2020-08-10 ENCOUNTER — Ambulatory Visit (INDEPENDENT_AMBULATORY_CARE_PROVIDER_SITE_OTHER): Payer: BC Managed Care – PPO | Admitting: Family Medicine

## 2020-08-10 VITALS — BP 124/72 | HR 88 | Temp 98.4°F | Resp 15 | Ht 65.0 in | Wt 162.4 lb

## 2020-08-10 DIAGNOSIS — J45909 Unspecified asthma, uncomplicated: Secondary | ICD-10-CM

## 2020-08-10 MED ORDER — ALBUTEROL SULFATE HFA 108 (90 BASE) MCG/ACT IN AERS
INHALATION_SPRAY | RESPIRATORY_TRACT | 2 refills | Status: DC
Start: 2020-08-10 — End: 2020-10-13

## 2020-08-10 MED ORDER — BUDESONIDE 90 MCG/ACT IN AEPB: 2.0000 | INHALATION_SPRAY | Freq: Two times a day (BID) | RESPIRATORY_TRACT | 5 refills | Status: DC

## 2020-08-10 NOTE — Progress Notes (Signed)
Subjective:  Patient ID: Matthew Brooks, male    DOB: [birth year]  Age: 45 y.o. MRN: [id number]  CC:  Chief Complaint  Patient presents with   Asthma    Pt reports doing well, no concerns since last visit     HPI Matthew Brooks presents for   Mild persistent Asthma: See prior notes. We have discussed maintenance vs rescue inhaler use and treatment approach. Had been doing better in past with Symbicort, reordered in February. Too expensive, and discount card expired He did not call for alternatives.  Has continued on monotherapy with albuterol - 2-4 puffs per day in warmer weather, up to 6 times per day in cooler. Dusty environment at work - wearing mask when cleaning up.   No fever, no chest pain.   History Patient Active Problem List   Diagnosis Date Noted   Requires typhoid vaccination 08/16/2017   Need for malaria prophylaxis 08/16/2017   Asthma 05/31/2015   Eczema 05/31/2015   Past Medical History:  Diagnosis Date   Asthma    Past Surgical History:  Procedure Laterality Date   DIRECT LARYNGOSCOPY N/A 05/02/2020   Procedure: DIRECT LARYNGOSCOPY WITH FOREIGN BODY REMOVAL;  Surgeon: Izora Gala, MD;  Location: [place];  Service: ENT;  Laterality: N/A;   Allergies  Allergen Reactions   Augmentin [Amoxicillin-Pot Clavulanate] Nausea And Vomiting   Prior to Admission medications   Medication Sig Start Date End Date Taking? Authorizing Provider  albuterol (VENTOLIN HFA) 108 (90 Base) MCG/ACT inhaler INHALE 1-2 PUFFS INTO THE LUNGS EVERY 4 HOURS AS NEEDED FOR WHEEZING/SHORTNESS OF BREATH 07/19/20  Yes Wendie Agreste, MD  budesonide-formoterol [facility]) 80-4.5 MCG/ACT inhaler Inhale 2 puffs into the lungs 2 (two) times daily. 04/21/20  Yes Wendie Agreste, MD  Naproxen Sod-diphenhydrAMINE (ALEVE PM) 220-25 MG TABS Take 1 tablet by mouth daily as needed (pain/sleep).   Yes [provider]   Social History   Socioeconomic History   Marital status: Married    Spouse name:  Not on file   Number of children: Not on file   Years of education: Not on file   Highest education level: Not on file  Occupational History   Not on file  Tobacco Use   Smoking status: Never   Smokeless tobacco: Never  Substance and Sexual Activity   Alcohol use: No   Drug use: No   Sexual activity: Yes  Other Topics Concern   Not on file  Social History Narrative   Not on file   Social Determinants of Health   Financial Resource Strain: Not on file  Food Insecurity: Not on file  Transportation Needs: Not on file  Physical Activity: Not on file  Stress: Not on file  Social Connections: Not on file  Intimate Partner Violence: Not on file    Review of Systems Per HPI.   Objective:   Vitals:   08/10/20 1536  BP: 124/72  Pulse: 88  Resp: 15  Temp: 98.4 F (36.9 C)  TempSrc: Temporal  SpO2: 96%  Weight: 162 lb 6.4 oz (73.7 kg)  Height: 5\' 5"  (1.651 m)     Physical Exam Vitals reviewed.  Constitutional:      Appearance: He is well-developed.  HENT:     Head: Normocephalic and atraumatic.  Neck:     Vascular: No carotid bruit or JVD.  Cardiovascular:     Rate and Rhythm: Normal rate and regular rhythm.     Heart sounds: Normal heart sounds. No murmur  heard. Pulmonary:     Effort: Pulmonary effort is normal.     Breath sounds: Wheezing (expiratory, faint. no distress, nl effort.) present. No rales.  Musculoskeletal:     Right lower leg: No edema.     Left lower leg: No edema.  Skin:    General: Skin is warm and dry.  Neurological:     Mental Status: He is alert and oriented to person, place, and time.  Psychiatric:        Mood and Affect: Mood normal.       Assessment & Plan:  Matthew Brooks is a 45 y.o. male . Persistent asthma without complication, unspecified asthma severity - Plan: Budesonide 90 MCG/ACT inhaler, albuterol (VENTOLIN HFA) 108 (90 Base) MCG/ACT inhaler  -Decreased control off Symbicort, cost prohibitive.  On my review it may be less  expensive to try Pulmicort with good Rx card provided if less expensive without insurance.  We will also have pharmacist check into patient assistance programs with pharmacy.  Continue albuterol as needed.  Recheck 2 months.   Meds ordered this encounter  Medications   Budesonide 90 MCG/ACT inhaler    Sig: Inhale 2 puffs into the lungs 2 (two) times daily.    Dispense:  1 each    Refill:  5   albuterol (VENTOLIN HFA) 108 (90 Base) MCG/ACT inhaler    Sig: INHALE 1-2 PUFFS INTO THE LUNGS EVERY 4 HOURS AS NEEDED FOR WHEEZING/SHORTNESS OF BREATH    Dispense:  8.5 each    Refill:  2   Patient Instructions  Try starting Pulmicort 2 puffs twice per day every day for maintenance for asthma with albuterol only as needed for wheezing.  I will have our pharmacist check into other options as Symbicort seemed to work well.  If Pulmicort is less expensive using the good Rx card and sent a file with your insurance, that is an option.  Recheck in 2 months.  Return to the clinic or go to the nearest emergency room if any of your symptoms worsen or new symptoms occur.     Signed, Merri Ray, MD [facility]

## 2020-08-10 NOTE — Patient Instructions (Signed)
Try starting Pulmicort 2 puffs twice per day every day for maintenance for asthma with albuterol only as needed for wheezing.  I will have our pharmacist check into other options as Symbicort seemed to work well.  If Pulmicort is less expensive using the good Rx card and sent a file with your insurance, that is an option.  Recheck in 2 months.  Return to the clinic or go to the nearest emergency room if any of your symptoms worsen or new symptoms occur.

## 2020-08-12 ENCOUNTER — Encounter: Payer: Self-pay | Admitting: Family Medicine

## 2020-08-17 ENCOUNTER — Telehealth: Payer: Self-pay

## 2020-08-17 NOTE — Telephone Encounter (Signed)
Multiple attempts to reach patient - will send out prefilled symbicort patient assistance application.   Patient is to return application to office once highlighted patient sections are complete. MD will then just need to sign and fax to [phone]  If patient returns call with questions I can be reached at [place], PharmD, CPP Clinical Pharmacist Practitioner  [facility]  [phone]

## 2020-10-13 ENCOUNTER — Ambulatory Visit (INDEPENDENT_AMBULATORY_CARE_PROVIDER_SITE_OTHER): Payer: BC Managed Care – PPO | Admitting: Family Medicine

## 2020-10-13 ENCOUNTER — Encounter: Payer: Self-pay | Admitting: Family Medicine

## 2020-10-13 ENCOUNTER — Other Ambulatory Visit: Payer: Self-pay

## 2020-10-13 DIAGNOSIS — J45909 Unspecified asthma, uncomplicated: Secondary | ICD-10-CM

## 2020-10-13 MED ORDER — ALBUTEROL SULFATE HFA 108 (90 BASE) MCG/ACT IN AERS: INHALATION_SPRAY | RESPIRATORY_TRACT | 2 refills | Status: DC

## 2020-10-13 MED ORDER — BUDESONIDE 90 MCG/ACT IN AEPB: 2.0000 | INHALATION_SPRAY | Freq: Two times a day (BID) | RESPIRATORY_TRACT | 5 refills | Status: DC

## 2020-10-13 NOTE — Progress Notes (Signed)
Subjective:  Patient ID: Matthew Brooks, male    DOB: March 11, 1975  Age: 45 y.o. MRN: [id number]  CC:  Chief Complaint  Patient presents with   Asthma    Pt here to review asthma no concerns doing well.     HPI Matthew Brooks presents for   Mild persistent asthma: See previous visits regarding discussions of maintenance versus rescue inhaler use.  Treated with Symbicort, cost prohibitive.  Visit June 8, using albuterol 2 to puffs per day in warm weather, 6 times per day in cooler weather.  Was wearing a dust mask and in dusty environments.  Referred to pharmacist to assist with medications and possible assistance programs.  Prescribed Pulmicort 90 mcg 2 puffs twice daily. Notes reviewed, pharmacist attempted multiple calls to patient, sent out Symbicort patient assistance application.  Feels well. Using pulmicort 2 puffs in am, 2 at night - daily. Only $18. Able to afford that one better.  Albuterol - only needing once per day or some days not at all. About 2 times per week.   History Patient Active Problem List   Diagnosis Date Noted   Requires typhoid vaccination 08/16/2017   Need for malaria prophylaxis 08/16/2017   Asthma 05/31/2015   Eczema 05/31/2015   Past Medical History:  Diagnosis Date   Asthma    Past Surgical History:  Procedure Laterality Date   DIRECT LARYNGOSCOPY N/A 05/02/2020   Procedure: DIRECT LARYNGOSCOPY WITH FOREIGN BODY REMOVAL;  Surgeon: Izora Gala, MD;  Location: [facility];  Service: ENT;  Laterality: N/A;   Allergies  Allergen Reactions   Augmentin [Amoxicillin-Pot Clavulanate] Nausea And Vomiting   Prior to Admission medications   Medication Sig Start Date End Date Taking? Authorizing Provider  albuterol (VENTOLIN HFA) 108 (90 Base) MCG/ACT inhaler INHALE 1-2 PUFFS INTO THE LUNGS EVERY 4 HOURS AS NEEDED FOR WHEEZING/SHORTNESS OF BREATH 08/10/20   Wendie Agreste, MD  Budesonide 90 MCG/ACT inhaler Inhale 2 puffs into the lungs 2 (two) times daily. 08/10/20    Wendie Agreste, MD  Naproxen Sod-diphenhydrAMINE (ALEVE PM) 220-25 MG TABS Take 1 tablet by mouth daily as needed (pain/sleep).    [provider]   Social History   Socioeconomic History   Marital status: Married    Spouse name: Not on file   Number of children: Not on file   Years of education: Not on file   Highest education level: Not on file  Occupational History   Not on file  Tobacco Use   Smoking status: Never   Smokeless tobacco: Never  Substance and Sexual Activity   Alcohol use: No   Drug use: No   Sexual activity: Yes  Other Topics Concern   Not on file  Social History Narrative   Not on file   Social Determinants of Health   Financial Resource Strain: Not on file  Food Insecurity: Not on file  Transportation Needs: Not on file  Physical Activity: Not on file  Stress: Not on file  Social Connections: Not on file  Intimate Partner Violence: Not on file    Review of Systems No CP, dyspnea, fever or new c/o.   Objective:   Vitals:   10/13/20 0948  BP: 126/68  Pulse: 100  Resp: 17  Temp: 98.3 F (36.8 C)  TempSrc: Temporal  SpO2: 98%  Weight: 165 lb 9.6 oz (75.1 kg)  Height: '5\' 5"'$  (1.651 m)     Physical Exam Vitals reviewed.  Constitutional:  Appearance: He is well-developed.  HENT:     Head: Normocephalic and atraumatic.  Neck:     Vascular: No carotid bruit or JVD.  Cardiovascular:     Rate and Rhythm: Normal rate and regular rhythm.     Heart sounds: Normal heart sounds. No murmur heard. Pulmonary:     Effort: Pulmonary effort is normal. No respiratory distress.     Breath sounds: Normal breath sounds. No stridor. No wheezing, rhonchi or rales.  Musculoskeletal:     Right lower leg: No edema.     Left lower leg: No edema.  Skin:    General: Skin is warm and dry.  Neurological:     Mental Status: He is alert and oriented to person, place, and time.  Psychiatric:        Mood and Affect: Mood normal.        Assessment & Plan:  Matthew Brooks is a 45 y.o. male . Persistent asthma without complication, unspecified asthma severity - Plan: Budesonide 90 MCG/ACT inhaler, albuterol (VENTOLIN HFA) 108 (90 Base) MCG/ACT inhaler  -Improved control on Pulmicort twice daily.  Continue same.  Albuterol if needed for breakthrough symptoms.  RTC precautions if increased frequency as needed with understanding expressed.  Could try symbicort with med assistance program if needed. Recheck 6 months.  Meds ordered this encounter  Medications   Budesonide 90 MCG/ACT inhaler    Sig: Inhale 2 puffs into the lungs 2 (two) times daily.    Dispense:  1 each    Refill:  5   albuterol (VENTOLIN HFA) 108 (90 Base) MCG/ACT inhaler    Sig: INHALE 1-2 PUFFS INTO THE LUNGS EVERY 4 HOURS AS NEEDED FOR WHEEZING/SHORTNESS OF BREATH    Dispense:  8.5 each    Refill:  2   Patient Instructions  Glad to hear that you are doing better.  Continue Pulmicort inhaler 2 puffs in the morning, 2 puffs at night every day with albuterol only as needed.  If you require albuterol more than once or twice per week let me know and we can fill out the paperwork to make that Symbicort less expensive.  Follow-up with me in 6 months, sooner if any new or worsening symptoms.  Asthma, Adult  Asthma is a long-term (chronic) condition in which the airways get tight and narrow. The airways are the breathing passages that lead from the nose and mouth down into the lungs. A person with asthma will have times when symptoms get worse. These are called asthma attacks. They can cause coughing, whistling sounds when you breathe (wheezing), shortness of breath, and chest pain. They can make it hard to breathe. Thereis no cure for asthma, but medicines and lifestyle changes can help control it. There are many things that can bring on an asthma attack or make asthma symptoms worse (triggers). Common triggers include: Mold. Dust. Cigarette  smoke. Cockroaches. Things that can cause allergy symptoms (allergens). These include animal skin flakes (dander) and pollen from trees or grass. Things that pollute the air. These may include household cleaners, wood smoke, smog, or chemical odors. Cold air, weather changes, and wind. Crying or laughing hard. Stress. Certain medicines or drugs. Certain foods such as dried fruit, potato chips, and grape juice. Infections, such as a cold or the flu. Certain medical conditions or diseases. Exercise or tiring activities. Asthma may be treated with medicines and by staying away from the things that cause asthma attacks. Types of medicines may include: Controller medicines. These help  prevent asthma symptoms. They are usually taken every day. Fast-acting reliever or rescue medicines. These quickly relieve asthma symptoms. They are used as needed and provide short-term relief. Allergy medicines if your attacks are brought on by allergens. Medicines to help control the body's defense (immune) system. Follow these instructions at home: Avoiding triggers in your home Change your heating and air conditioning filter often. Limit your use of fireplaces and wood stoves. Get rid of pests (such as roaches and mice) and their droppings. Throw away plants if you see mold on them. Clean your floors. Dust regularly. Use cleaning products that do not smell. Have someone vacuum when you are not home. Use a vacuum cleaner with a HEPA filter if possible. Replace carpet with wood, tile, or vinyl flooring. Carpet can trap animal skin flakes and dust. Use allergy-proof pillows, mattress covers, and box spring covers. Wash bed sheets and blankets every week in hot water. Dry them in a dryer. Keep your bedroom free of any triggers. Avoid pets and keep windows closed when things that cause allergy symptoms are in the air. Use blankets that are made of polyester or cotton. Clean bathrooms and kitchens with bleach.  If possible, have someone repaint the walls in these rooms with mold-resistant paint. Keep out of the rooms that are being cleaned and painted. Wash your hands often with soap and water. If soap and water are not available, use hand sanitizer. Do not allow anyone to smoke in your home. General instructions Take over-the-counter and prescription medicines only as told by your doctor. Talk with your doctor if you have questions about how or when to take your medicines. Make note if you need to use your medicines more often than usual. Do not use any products that contain nicotine or tobacco, such as cigarettes and e-cigarettes. If you need help quitting, ask your doctor. Stay away from secondhand smoke. Avoid doing things outdoors when allergen counts are high and when air quality is low. Wear a ski mask when doing outdoor activities in the winter. The mask should cover your nose and mouth. Exercise indoors on cold days if you can. Warm up before you exercise. Take time to cool down after exercise. Use a peak flow meter as told by your doctor. A peak flow meter is a tool that measures how well the lungs are working. Keep track of the peak flow meter's readings. Write them down. Follow your asthma action plan. This is a written plan for taking care of your asthma and treating your attacks. Make sure you get all the shots (vaccines) that your doctor recommends. Ask your doctor about a flu shot and a pneumonia shot. Keep all follow-up visits as told by your doctor. This is important. Contact a doctor if: You have wheezing, shortness of breath, or a cough even while taking medicine to prevent attacks. The mucus you cough up (sputum) is thicker than usual. The mucus you cough up changes from clear or white to yellow, green, gray, or bloody. You have problems from the medicine you are taking, such as: A rash. Itching. Swelling. Trouble breathing. You need reliever medicines more than 2-3 times a  week. Your peak flow reading is still at 50-79% of your personal best after following the action plan for 1 hour. You have a fever. Get help right away if: You seem to be worse and are not responding to medicine during an asthma attack. You are short of breath even at rest. You get short of breath  when doing very little activity. You have trouble eating, drinking, or talking. You have chest pain or tightness. You have a fast heartbeat. Your lips or fingernails start to turn blue. You are light-headed or dizzy, or you faint. Your peak flow is less than 50% of your personal best. You feel too tired to breathe normally. Summary Asthma is a long-term (chronic) condition in which the airways get tight and narrow. An asthma attack can make it hard to breathe. Asthma cannot be cured, but medicines and lifestyle changes can help control it. Make sure you understand how to avoid triggers and how and when to use your medicines. This information is not intended to replace advice given to you by your health care provider. Make sure you discuss any questions you have with your healthcare provider. Document Revised: 06/24/2019 Document Reviewed: 06/24/2019 Elsevier Patient Education  2022 [employer],   Merri Ray, MD [place] 10/13/20 10:23 AM

## 2020-10-13 NOTE — Patient Instructions (Addendum)
Glad to hear that you are doing better.  Continue Pulmicort inhaler 2 puffs in the morning, 2 puffs at night every day with albuterol only as needed.  If you require albuterol more than once or twice per week let me know and we can fill out the paperwork to make that Symbicort less expensive.  Follow-up with me in 6 months, sooner if any new or worsening symptoms.  Asthma, Adult  Asthma is a long-term (chronic) condition in which the airways get tight and narrow. The airways are the breathing passages that lead from the nose and mouth down into the lungs. A person with asthma will have times when symptoms get worse. These are called asthma attacks. They can cause coughing, whistling sounds when you breathe (wheezing), shortness of breath, and chest pain. They can make it hard to breathe. Thereis no cure for asthma, but medicines and lifestyle changes can help control it. There are many things that can bring on an asthma attack or make asthma symptoms worse (triggers). Common triggers include: Mold. Dust. Cigarette smoke. Cockroaches. Things that can cause allergy symptoms (allergens). These include animal skin flakes (dander) and pollen from trees or grass. Things that pollute the air. These may include household cleaners, wood smoke, smog, or chemical odors. Cold air, weather changes, and wind. Crying or laughing hard. Stress. Certain medicines or drugs. Certain foods such as dried fruit, potato chips, and grape juice. Infections, such as a cold or the flu. Certain medical conditions or diseases. Exercise or tiring activities. Asthma may be treated with medicines and by staying away from the things that cause asthma attacks. Types of medicines may include: Controller medicines. These help prevent asthma symptoms. They are usually taken every day. Fast-acting reliever or rescue medicines. These quickly relieve asthma symptoms. They are used as needed and provide short-term relief. Allergy  medicines if your attacks are brought on by allergens. Medicines to help control the body's defense (immune) system. Follow these instructions at home: Avoiding triggers in your home Change your heating and air conditioning filter often. Limit your use of fireplaces and wood stoves. Get rid of pests (such as roaches and mice) and their droppings. Throw away plants if you see mold on them. Clean your floors. Dust regularly. Use cleaning products that do not smell. Have someone vacuum when you are not home. Use a vacuum cleaner with a HEPA filter if possible. Replace carpet with wood, tile, or vinyl flooring. Carpet can trap animal skin flakes and dust. Use allergy-proof pillows, mattress covers, and box spring covers. Wash bed sheets and blankets every week in hot water. Dry them in a dryer. Keep your bedroom free of any triggers. Avoid pets and keep windows closed when things that cause allergy symptoms are in the air. Use blankets that are made of polyester or cotton. Clean bathrooms and kitchens with bleach. If possible, have someone repaint the walls in these rooms with mold-resistant paint. Keep out of the rooms that are being cleaned and painted. Wash your hands often with soap and water. If soap and water are not available, use hand sanitizer. Do not allow anyone to smoke in your home. General instructions Take over-the-counter and prescription medicines only as told by your doctor. Talk with your doctor if you have questions about how or when to take your medicines. Make note if you need to use your medicines more often than usual. Do not use any products that contain nicotine or tobacco, such as cigarettes and e-cigarettes. If you  need help quitting, ask your doctor. Stay away from secondhand smoke. Avoid doing things outdoors when allergen counts are high and when air quality is low. Wear a ski mask when doing outdoor activities in the winter. The mask should cover your nose and  mouth. Exercise indoors on cold days if you can. Warm up before you exercise. Take time to cool down after exercise. Use a peak flow meter as told by your doctor. A peak flow meter is a tool that measures how well the lungs are working. Keep track of the peak flow meter's readings. Write them down. Follow your asthma action plan. This is a written plan for taking care of your asthma and treating your attacks. Make sure you get all the shots (vaccines) that your doctor recommends. Ask your doctor about a flu shot and a pneumonia shot. Keep all follow-up visits as told by your doctor. This is important. Contact a doctor if: You have wheezing, shortness of breath, or a cough even while taking medicine to prevent attacks. The mucus you cough up (sputum) is thicker than usual. The mucus you cough up changes from clear or white to yellow, green, gray, or bloody. You have problems from the medicine you are taking, such as: A rash. Itching. Swelling. Trouble breathing. You need reliever medicines more than 2-3 times a week. Your peak flow reading is still at 50-79% of your personal best after following the action plan for 1 hour. You have a fever. Get help right away if: You seem to be worse and are not responding to medicine during an asthma attack. You are short of breath even at rest. You get short of breath when doing very little activity. You have trouble eating, drinking, or talking. You have chest pain or tightness. You have a fast heartbeat. Your lips or fingernails start to turn blue. You are light-headed or dizzy, or you faint. Your peak flow is less than 50% of your personal best. You feel too tired to breathe normally. Summary Asthma is a long-term (chronic) condition in which the airways get tight and narrow. An asthma attack can make it hard to breathe. Asthma cannot be cured, but medicines and lifestyle changes can help control it. Make sure you understand how to avoid  triggers and how and when to use your medicines. This information is not intended to replace advice given to you by your health care provider. Make sure you discuss any questions you have with your healthcare provider. Document Revised: 06/24/2019 Document Reviewed: 06/24/2019 Elsevier Patient Education  2022 [employer].

## 2020-11-03 ENCOUNTER — Other Ambulatory Visit: Payer: Self-pay | Admitting: Family Medicine

## 2020-11-03 DIAGNOSIS — J45909 Unspecified asthma, uncomplicated: Secondary | ICD-10-CM

## 2021-04-17 ENCOUNTER — Ambulatory Visit: Payer: BC Managed Care – PPO | Admitting: Family Medicine

## 2022-10-15 ENCOUNTER — Telehealth: Payer: Self-pay | Admitting: Family Medicine

## 2022-10-15 NOTE — Telephone Encounter (Signed)
Last seen in 2022-  Spoke with patient and they would like to continue care with the current PCP but did not schedule annual physical; will call back

## 2023-04-15 ENCOUNTER — Ambulatory Visit (INDEPENDENT_AMBULATORY_CARE_PROVIDER_SITE_OTHER): Payer: BC Managed Care – PPO | Admitting: Family Medicine

## 2023-04-15 ENCOUNTER — Encounter: Payer: Self-pay | Admitting: Family Medicine

## 2023-04-15 ENCOUNTER — Other Ambulatory Visit: Payer: BC Managed Care – PPO

## 2023-04-15 VITALS — BP 118/70 | HR 73 | Temp 98.0°F | Ht 65.75 in | Wt 167.6 lb

## 2023-04-15 DIAGNOSIS — Z0000 Encounter for general adult medical examination without abnormal findings: Secondary | ICD-10-CM | POA: Diagnosis not present

## 2023-04-15 DIAGNOSIS — Z130 Encounter for screening for diseases of the blood and blood-forming organs and certain disorders involving the immune mechanism: Secondary | ICD-10-CM

## 2023-04-15 DIAGNOSIS — Z23 Encounter for immunization: Secondary | ICD-10-CM | POA: Diagnosis not present

## 2023-04-15 DIAGNOSIS — Z125 Encounter for screening for malignant neoplasm of prostate: Secondary | ICD-10-CM | POA: Diagnosis not present

## 2023-04-15 DIAGNOSIS — J45909 Unspecified asthma, uncomplicated: Secondary | ICD-10-CM

## 2023-04-15 DIAGNOSIS — R7309 Other abnormal glucose: Secondary | ICD-10-CM | POA: Diagnosis not present

## 2023-04-15 DIAGNOSIS — Z114 Encounter for screening for human immunodeficiency virus [HIV]: Secondary | ICD-10-CM

## 2023-04-15 DIAGNOSIS — Z131 Encounter for screening for diabetes mellitus: Secondary | ICD-10-CM | POA: Diagnosis not present

## 2023-04-15 DIAGNOSIS — Z13220 Encounter for screening for lipoid disorders: Secondary | ICD-10-CM | POA: Diagnosis not present

## 2023-04-15 DIAGNOSIS — Z1159 Encounter for screening for other viral diseases: Secondary | ICD-10-CM | POA: Diagnosis not present

## 2023-04-15 DIAGNOSIS — Z1211 Encounter for screening for malignant neoplasm of colon: Secondary | ICD-10-CM

## 2023-04-15 LAB — CBC WITH DIFFERENTIAL/PLATELET
Basophils Absolute: 0 10*3/uL (ref 0.0–0.1)
Basophils Relative: 0.3 % (ref 0.0–3.0)
Eosinophils Absolute: 0.3 10*3/uL (ref 0.0–0.7)
Eosinophils Relative: 3.9 % (ref 0.0–5.0)
HCT: 44.7 % (ref 39.0–52.0)
Hemoglobin: 14.6 g/dL (ref 13.0–17.0)
Lymphocytes Relative: 32.5 % (ref 12.0–46.0)
Lymphs Abs: 2.5 10*3/uL (ref 0.7–4.0)
MCHC: 32.7 g/dL (ref 30.0–36.0)
MCV: 79.7 fL (ref 78.0–100.0)
Monocytes Absolute: 0.5 10*3/uL (ref 0.1–1.0)
Monocytes Relative: 6.2 % (ref 3.0–12.0)
Neutro Abs: 4.3 10*3/uL (ref 1.4–7.7)
Neutrophils Relative %: 57.1 % (ref 43.0–77.0)
Platelets: 193 10*3/uL (ref 150.0–400.0)
RBC: 5.61 Mil/uL (ref 4.22–5.81)
RDW: 13.3 % (ref 11.5–15.5)
WBC: 7.6 10*3/uL (ref 4.0–10.5)

## 2023-04-15 LAB — LIPID PANEL
Cholesterol: 211 mg/dL — ABNORMAL HIGH (ref 0–200)
HDL: 38.3 mg/dL — ABNORMAL LOW (ref >39.00)
LDL Cholesterol: 137 mg/dL — ABNORMAL HIGH (ref 0–99)
NonHDL: 173.01
Total CHOL/HDL Ratio: 6
Triglycerides: 182 mg/dL — ABNORMAL HIGH (ref 0.0–149.0)
VLDL: 36.4 mg/dL (ref 0.0–40.0)

## 2023-04-15 LAB — COMPREHENSIVE METABOLIC PANEL
ALT: 23 U/L (ref 0–53)
AST: 25 U/L (ref 0–37)
Albumin: 4.3 g/dL (ref 3.5–5.2)
Alkaline Phosphatase: 73 U/L (ref 39–117)
BUN: 13 mg/dL (ref 6–23)
CO2: 32 meq/L (ref 19–32)
Calcium: 9.3 mg/dL (ref 8.4–10.5)
Chloride: 101 meq/L (ref 96–112)
Creatinine, Ser: 0.89 mg/dL (ref 0.40–1.50)
GFR: 102.34 mL/min (ref >60.00)
Glucose, Bld: 140 mg/dL — ABNORMAL HIGH (ref 70–99)
Potassium: 3.9 meq/L (ref 3.5–5.1)
Sodium: 140 meq/L (ref 135–145)
Total Bilirubin: 0.5 mg/dL (ref 0.2–1.2)
Total Protein: 7.7 g/dL (ref 6.0–8.3)

## 2023-04-15 LAB — PSA: PSA: 0.63 ng/mL (ref 0.10–4.00)

## 2023-04-15 MED ORDER — ALBUTEROL SULFATE HFA 108 (90 BASE) MCG/ACT IN AERS: INHALATION_SPRAY | RESPIRATORY_TRACT | 2 refills | Status: DC

## 2023-04-15 MED ORDER — BUDESONIDE 90 MCG/ACT IN AEPB: 2.0000 | INHALATION_SPRAY | Freq: Two times a day (BID) | RESPIRATORY_TRACT | 5 refills | Status: DC

## 2023-04-15 NOTE — Progress Notes (Signed)
 Subjective:  Patient ID: Matthew Brooks, male    DOB: 1975/08/03  Age: 48 y.o. MRN: [id number]  CC:  Chief Complaint  Patient presents with   Annual Exam    Pt notes doing okay, fasting   HPI Matthew Brooks presents for Annual Exam And medication follow-up.  Last visit in 2022.  Asthma Mild persistent by history, last discussed in 2022.  At that time on Pulmicort  2 puffs twice daily for maintenance, albuterol  only as needed -approximately 2 times per week at that time. Had been off inhalers for 3 months, but back on meds now.  No recent need for albuterol .  Off pulmicort .  Spring time or cold weather can flare asthma.  Now working in different area of job with less dust.   No health changes.     04/15/2023    8:46 AM 10/13/2020    9:51 AM 08/10/2020    3:39 PM 04/21/2020    1:19 PM 04/21/2020    1:11 PM  Depression screen PHQ 2/9  Decreased Interest 0 2 0 0 0  Down, Depressed, Hopeless 0 2 0 0 0  PHQ - 2 Score 0 4 0 0 0  Altered sleeping 0 1 0    Tired, decreased energy 0 1 0    Change in appetite 0 0 0    Feeling bad or failure about yourself  0 0 0    Trouble concentrating 0 1 0    Moving slowly or fidgety/restless 0 0 0    Suicidal thoughts 0 0 0    PHQ-9 Score 0 7 0      Health Maintenance  Topic Date Due   Pneumococcal Vaccine 68-85 Years old (1 of 2 - PCV) Never done   HIV Screening  Never done   Hepatitis C Screening  Never done   DTaP/Tdap/Td (1 - Tdap) Never done   Colonoscopy  Never done   INFLUENZA VACCINE  10/04/2022   COVID-19 Vaccine (1 - 2024-25 season) Never done   HPV VACCINES  Aged Out  No FH of colon CA, no polyps or rectal bleeding hx.  Screening options with colonoscopy versus Cologuard discussed. Discussed timing of repeat testing intervals if normal, as well as potential need for diagnostic Colonoscopy if positive Cologuard. Understanding expressed, and chose Colonoscopy. Prostate: does not  have family history of prostate cancer The natural history  of prostate cancer and ongoing controversy regarding screening and potential treatment outcomes of prostate cancer has been discussed with the patient. The meaning of a false positive PSA and a false negative PSA has been discussed. He indicates understanding of the limitations of this screening test and wishes  to proceed with screening PSA testing. No results found for: "PSA1", "PSA"    Immunization History  Administered Date(s) Administered   Hepatitis A, Adult 08/16/2017   Hepatitis B, ADULT 08/16/2017   Influenza,inj,Quad PF,6+ Mos 11/30/2016   MMR 08/16/2017  Flu vaccine today.   Pneumonia vaccine today.  Hep C and HIV test today.   No results found. No corrective lenses. Hard to see small letters. Plans to see eye doctor at [facility].  Dental: Has seen last week. 4 extractions.   Alcohol: None  Tobacco:  None  Exercise: Physical work. Walking at times for exercise in warmer weather.  History Patient Active Problem List   Diagnosis Date Noted   Requires typhoid vaccination 08/16/2017   Need for malaria prophylaxis 08/16/2017   Asthma 05/31/2015   Eczema 05/31/2015  Past Medical History:  Diagnosis Date   Asthma    Past Surgical History:  Procedure Laterality Date   DIRECT LARYNGOSCOPY N/A 05/02/2020   Procedure: DIRECT LARYNGOSCOPY WITH FOREIGN BODY REMOVAL;  Surgeon: Janita Mellow, MD;  Location: [facility] OR;  Service: ENT;  Laterality: N/A;   Allergies  Allergen Reactions   Augmentin  [Amoxicillin -Pot Clavulanate] Nausea And Vomiting   Prior to Admission medications   Medication Sig Start Date End Date Taking? Authorizing Provider  albuterol  (VENTOLIN  HFA) 108 (90 Base) MCG/ACT inhaler INHALE 1-2 PUFFS INTO THE LUNGS EVERY 4 HOURS AS NEEDED FOR WHEEZING/SHORTNESS OF BREATH 10/13/20   Benjiman Bras, MD  Budesonide  90 MCG/ACT inhaler Inhale 2 puffs into the lungs 2 (two) times daily. 10/13/20   Benjiman Bras, MD  Naproxen Sod-diphenhydrAMINE  (ALEVE PM) 220-25  MG TABS Take 1 tablet by mouth daily as needed (pain/sleep).    [provider]   Social History   Socioeconomic History   Marital status: Married    Spouse name: Not on file   Number of children: Not on file   Years of education: Not on file   Highest education level: Not on file  Occupational History   Not on file  Tobacco Use   Smoking status: Never   Smokeless tobacco: Never  Substance and Sexual Activity   Alcohol use: No   Drug use: No   Sexual activity: Yes  Other Topics Concern   Not on file  Social History Narrative   Not on file   Social Drivers of Health   Financial Resource Strain: Not on file  Food Insecurity: Not on file  Transportation Needs: Not on file  Physical Activity: Not on file  Stress: Not on file  Social Connections: Unknown (07/18/2021)   Received from [facility]   Social Network    Social Network: Not on file  Intimate Partner Violence: Unknown (06/08/2021)   Received from [facility]   HITS    Physically Hurt: Not on file    Insult or Talk Down To: Not on file    Threaten Physical Harm: Not on file    Scream or Curse: Not on file    Review of Systems 13 point review of systems per patient health survey noted.  Negative other than as indicated above or in HPI.    Objective:   Vitals:   04/15/23 0848  BP: 118/70  Pulse: 73  Temp: 98 F (36.7 C)  TempSrc: Temporal  SpO2: 97%  Weight: 167 lb 9.6 oz (76 kg)  Height: 5' 5.75" (1.67 m)     Physical Exam Vitals reviewed.  Constitutional:      Appearance: He is well-developed.  HENT:     Head: Normocephalic and atraumatic.     Right Ear: External ear normal.     Left Ear: External ear normal.  Eyes:     Conjunctiva/sclera: Conjunctivae normal.     Pupils: Pupils are equal, round, and reactive to light.  Neck:     Thyroid: No thyromegaly.  Cardiovascular:     Rate and Rhythm: Normal rate and regular rhythm.     Heart sounds: Normal heart sounds.  Pulmonary:      Effort: Pulmonary effort is normal. No respiratory distress.     Breath sounds: Normal breath sounds. No wheezing.  Abdominal:     General: There is no distension.     Palpations: Abdomen is soft.     Tenderness: There is no abdominal tenderness.  Musculoskeletal:  General: No tenderness. Normal range of motion.     Cervical back: Normal range of motion and neck supple.  Lymphadenopathy:     Cervical: No cervical adenopathy.  Skin:    General: Skin is warm and dry.  Neurological:     Mental Status: He is alert and oriented to person, place, and time.     Deep Tendon Reflexes: Reflexes are normal and symmetric.  Psychiatric:        Behavior: Behavior normal.        Assessment & Plan:  Matthew Brooks is a 48 y.o. male . Annual physical exam - Plan: Ambulatory referral to Gastroenterology, Hepatitis C Antibody, HIV antibody (with reflex), Pneumococcal conjugate vaccine 20-valent (Prevnar 20), CBC with Differential/Platelet, Comprehensive metabolic panel, Lipid panel, PSA, Hemoglobin A1c  - -anticipatory guidance as below in AVS, screening labs above. Health maintenance items as above in HPI discussed/recommended as applicable.   Persistent asthma without complication, unspecified asthma severity - Plan: albuterol  (VENTOLIN  HFA) 108 (90 Base) MCG/ACT inhaler, Budesonide  90 MCG/ACT inhaler  - controlled.  Will refill budesonide  same dose for now, with option to try off meds during summer as less dusty environment at work may be helping control. Continue albuterol  if needed, with recheck in 6 months, rtc precautions.   Need for pneumococcal vaccination - Plan: Pneumococcal conjugate vaccine 20-valent (Prevnar 20)  Screening for diabetes mellitus - Plan: Hemoglobin A1c  Need for hepatitis C screening test - Plan: Hepatitis C Antibody  Screening for HIV (human immunodeficiency virus) - Plan: HIV antibody (with reflex)  Screening for colon cancer - Plan: Ambulatory referral to  Gastroenterology  Screening for prostate cancer - Plan: PSA  Screening for deficiency anemia - Plan: CBC with Differential/Platelet  Screening for hyperlipidemia - Plan: Comprehensive metabolic panel, Lipid panel   Meds ordered this encounter  Medications   albuterol  (VENTOLIN  HFA) 108 (90 Base) MCG/ACT inhaler    Sig: INHALE 1-2 PUFFS INTO THE LUNGS EVERY 4 HOURS AS NEEDED FOR WHEEZING/SHORTNESS OF BREATH    Dispense:  8.5 each    Refill:  2   Budesonide  90 MCG/ACT inhaler    Sig: Inhale 2 puffs into the lungs 2 (two) times daily.    Dispense:  1 each    Refill:  5   Patient Instructions  No change in meds for now. If asthma well controlled this summer, we can try stopping the pulmicort  to see if it is still needed. If you need the albuterol  (red rescue inhaler) then need to restart the pulmicort  consistently.  I will refer you to gastroenterology for colon cancer screening.  I am checking lab work today and if any concerns will let you know.  Flu vaccine was given today as well as the pneumonia vaccine.  Recheck in 6 months but let me know if there are any questions sooner.  Take care!  Preventive Care 73-70 Years Old, Male Preventive care refers to lifestyle choices and visits with your health care provider that can promote health and wellness. Preventive care visits are also called wellness exams. What can I expect for my preventive care visit? Counseling During your preventive care visit, your health care provider may ask about your: Medical history, including: Past medical problems. Family medical history. Current health, including: Emotional well-being. Home life and relationship well-being. Sexual activity. Lifestyle, including: Alcohol, nicotine or tobacco, and drug use. Access to firearms. Diet, exercise, and sleep habits. Safety issues such as seatbelt and bike helmet use. Sunscreen use. Work and  work environment. Physical exam Your health care provider will  check your: Height and weight. These may be used to calculate your BMI (body mass index). BMI is a measurement that tells if you are at a healthy weight. Waist circumference. This measures the distance around your waistline. This measurement also tells if you are at a healthy weight and may help predict your risk of certain diseases, such as type 2 diabetes and high blood pressure. Heart rate and blood pressure. Body temperature. Skin for abnormal spots. What immunizations do I need?  Vaccines are usually given at various ages, according to a schedule. Your health care provider will recommend vaccines for you based on your age, medical history, and lifestyle or other factors, such as travel or where you work. What tests do I need? Screening Your health care provider may recommend screening tests for certain conditions. This may include: Lipid and cholesterol levels. Diabetes screening. This is done by checking your blood sugar (glucose) after you have not eaten for a while (fasting). Hepatitis B test. Hepatitis C test. HIV (human immunodeficiency virus) test. STI (sexually transmitted infection) testing, if you are at risk. Lung cancer screening. Prostate cancer screening. Colorectal cancer screening. Talk with your health care provider about your test results, treatment options, and if necessary, the need for more tests. Follow these instructions at home: Eating and drinking  Eat a diet that includes fresh fruits and vegetables, whole grains, lean protein, and low-fat dairy products. Take vitamin and mineral supplements as recommended by your health care provider. Do not drink alcohol if your health care provider tells you not to drink. If you drink alcohol: Limit how much you have to 0-2 drinks a day. Know how much alcohol is in your drink. In the [place] one drink equals one 12 oz bottle of beer (355 mL), one 5 oz glass of wine (148 mL), or one 1 oz glass of hard liquor (44  mL). Lifestyle Brush your teeth every morning and night with fluoride toothpaste. Floss one time each day. Exercise for at least 30 minutes 5 or more days each week. Do not use any products that contain nicotine or tobacco. These products include cigarettes, chewing tobacco, and vaping devices, such as e-cigarettes. If you need help quitting, ask your health care provider. Do not use drugs. If you are sexually active, practice safe sex. Use a condom or other form of protection to prevent STIs. Take aspirin only as told by your health care provider. Make sure that you understand how much to take and what form to take. Work with your health care provider to find out whether it is safe and beneficial for you to take aspirin daily. Find healthy ways to manage stress, such as: Meditation, yoga, or listening to music. Journaling. Talking to a trusted person. Spending time with friends and family. Minimize exposure to UV radiation to reduce your risk of skin cancer. Safety Always wear your seat belt while driving or riding in a vehicle. Do not drive: If you have been drinking alcohol. Do not ride with someone who has been drinking. When you are tired or distracted. While texting. If you have been using any mind-altering substances or drugs. Wear a helmet and other protective equipment during sports activities. If you have firearms in your house, make sure you follow all gun safety procedures. What's next? Go to your health care provider once a year for an annual wellness visit. Ask your health care provider how often you should have your  eyes and teeth checked. Stay up to date on all vaccines. This information is not intended to replace advice given to you by your health care provider. Make sure you discuss any questions you have with your health care provider. Document Revised: 08/17/2020 Document Reviewed: 08/17/2020 Elsevier Patient Education  2024 Elsevier Inc.       Signed,    Caro Christmas, MD [facility], [facility] 04/15/23 9:25 AM

## 2023-04-15 NOTE — Patient Instructions (Addendum)
 No change in meds for now. If asthma well controlled this summer, we can try stopping the pulmicort  to see if it is still needed. If you need the albuterol  (red rescue inhaler) then need to restart the pulmicort  consistently.  I will refer you to gastroenterology for colon cancer screening.  I am checking lab work today and if any concerns will let you know.  Flu vaccine was given today as well as the pneumonia vaccine.  Recheck in 6 months but let me know if there are any questions sooner.  Take care!  Preventive Care 33-51 Years Old, Male Preventive care refers to lifestyle choices and visits with your health care provider that can promote health and wellness. Preventive care visits are also called wellness exams. What can I expect for my preventive care visit? Counseling During your preventive care visit, your health care provider may ask about your: Medical history, including: Past medical problems. Family medical history. Current health, including: Emotional well-being. Home life and relationship well-being. Sexual activity. Lifestyle, including: Alcohol, nicotine or tobacco, and drug use. Access to firearms. Diet, exercise, and sleep habits. Safety issues such as seatbelt and bike helmet use. Sunscreen use. Work and work Astronomer. Physical exam Your health care provider will check your: Height and weight. These may be used to calculate your BMI (body mass index). BMI is a measurement that tells if you are at a healthy weight. Waist circumference. This measures the distance around your waistline. This measurement also tells if you are at a healthy weight and may help predict your risk of certain diseases, such as type 2 diabetes and high blood pressure. Heart rate and blood pressure. Body temperature. Skin for abnormal spots. What immunizations do I need?  Vaccines are usually given at various ages, according to a schedule. Your health care provider will recommend vaccines  for you based on your age, medical history, and lifestyle or other factors, such as travel or where you work. What tests do I need? Screening Your health care provider may recommend screening tests for certain conditions. This may include: Lipid and cholesterol levels. Diabetes screening. This is done by checking your blood sugar (glucose) after you have not eaten for a while (fasting). Hepatitis B test. Hepatitis C test. HIV (human immunodeficiency virus) test. STI (sexually transmitted infection) testing, if you are at risk. Lung cancer screening. Prostate cancer screening. Colorectal cancer screening. Talk with your health care provider about your test results, treatment options, and if necessary, the need for more tests. Follow these instructions at home: Eating and drinking  Eat a diet that includes fresh fruits and vegetables, whole grains, lean protein, and low-fat dairy products. Take vitamin and mineral supplements as recommended by your health care provider. Do not drink alcohol if your health care provider tells you not to drink. If you drink alcohol: Limit how much you have to 0-2 drinks a day. Know how much alcohol is in your drink. In the [place] one drink equals one 12 oz bottle of beer (355 mL), one 5 oz glass of wine (148 mL), or one 1 oz glass of hard liquor (44 mL). Lifestyle Brush your teeth every morning and night with fluoride toothpaste. Floss one time each day. Exercise for at least 30 minutes 5 or more days each week. Do not use any products that contain nicotine or tobacco. These products include cigarettes, chewing tobacco, and vaping devices, such as e-cigarettes. If you need help quitting, ask your health care provider. Do not use drugs.  If you are sexually active, practice safe sex. Use a condom or other form of protection to prevent STIs. Take aspirin only as told by your health care provider. Make sure that you understand how much to take and what form to  take. Work with your health care provider to find out whether it is safe and beneficial for you to take aspirin daily. Find healthy ways to manage stress, such as: Meditation, yoga, or listening to music. Journaling. Talking to a trusted person. Spending time with friends and family. Minimize exposure to UV radiation to reduce your risk of skin cancer. Safety Always wear your seat belt while driving or riding in a vehicle. Do not drive: If you have been drinking alcohol. Do not ride with someone who has been drinking. When you are tired or distracted. While texting. If you have been using any mind-altering substances or drugs. Wear a helmet and other protective equipment during sports activities. If you have firearms in your house, make sure you follow all gun safety procedures. What's next? Go to your health care provider once a year for an annual wellness visit. Ask your health care provider how often you should have your eyes and teeth checked. Stay up to date on all vaccines. This information is not intended to replace advice given to you by your health care provider. Make sure you discuss any questions you have with your health care provider. Document Revised: 08/17/2020 Document Reviewed: 08/17/2020 Elsevier Patient Education  2024 [employer].

## 2023-04-16 LAB — HEPATITIS C ANTIBODY: Hepatitis C Ab: NONREACTIVE

## 2023-04-16 LAB — HEMOGLOBIN A1C
Est. average glucose Bld gHb Est-mCnc: 186 mg/dL
Hgb A1c MFr Bld: 8.1 % — ABNORMAL HIGH (ref 4.8–5.6)

## 2023-04-16 LAB — HIV ANTIBODY (ROUTINE TESTING W REFLEX): HIV 1&2 Ab, 4th Generation: NONREACTIVE

## 2023-04-19 ENCOUNTER — Telehealth: Payer: Self-pay

## 2023-04-19 NOTE — Telephone Encounter (Signed)
Blood sugar elevated at 140, other electrolytes looked okay.  Cholesterol levels were elevated, would like to see LDL cholesterol about half of the current level.  Hepatitis C test was normal/nonreactive.  HIV test was normal/nonreactive.  Blood counts were normal.  Prostate test was normal.  A1C elevated at 8.1.  This is at diabetes level and we will need to discuss medications.   Please schedule patient an appointment next few weeks to discuss treatment plan

## 2023-04-23 NOTE — Telephone Encounter (Signed)
 Attempted to call both numbers with no success. No VM set up.

## 2023-04-24 NOTE — Telephone Encounter (Signed)
 Unable to LM today, will send lab letter once back in the office

## 2023-04-24 NOTE — Telephone Encounter (Signed)
 letter

## 2023-04-25 NOTE — Telephone Encounter (Signed)
 Letter sent.

## 2023-05-06 ENCOUNTER — Telehealth: Payer: Self-pay

## 2023-05-06 NOTE — Telephone Encounter (Signed)
 Patient needs an appointment, please help him find one that fits his schedule as best as we can Unfortunately if there are no AM appts we are bound by the providers schedule.

## 2023-05-06 NOTE — Telephone Encounter (Signed)
 Copied from CRM [id number]. Topic: Clinical - Lab/Test Results >> May 06, 2023 11:07 AM Matthew Brooks wrote: Reason for CRM: Patient & his son [name] called in regards to lab results - relayed message left in chart, patient wants to confirm if he has diabetes.     Patient is wanting an appointment for treatment plan ASAP in the AM, only appointment left in March is in the afternoon.

## 2023-05-07 ENCOUNTER — Telehealth: Payer: Self-pay | Admitting: Family Medicine

## 2023-05-07 NOTE — Telephone Encounter (Signed)
Left message for pt to call and schedule

## 2023-05-07 NOTE — Telephone Encounter (Signed)
 E2C2 agent took the return call and is scheduling an appt to discuss labs

## 2023-05-07 NOTE — Telephone Encounter (Signed)
Left message for pt to call and schedule an appt

## 2023-06-10 ENCOUNTER — Ambulatory Visit: Admitting: Family Medicine

## 2023-06-10 ENCOUNTER — Encounter: Payer: Self-pay | Admitting: Family Medicine

## 2023-06-10 NOTE — Progress Notes (Deleted)
 Subjective:  Patient ID: Arjun Hard, male    DOB: 12/21/1975  Age: 48 y.o. MRN: [id number]  CC: No chief complaint on file.   HPI Blayke Kunzler presents for   Diabetes:  Elevated A1c noted in February.  With glucose 140, A1c 8.1.  No current meds.    Microalbumin: *** Optho, foot exam, pneumovax: ***  Lab Results  Component Value Date   HGBA1C 8.1 (H) 04/15/2023   Lab Results  Component Value Date   LDLCALC 137 (H) 04/15/2023   CREATININE 0.89 04/15/2023   Hyperlipidemia: Recent elevations with LDL 137, no current statin.  Diabetes as above. Lab Results  Component Value Date   CHOL 211 (H) 04/15/2023   HDL 38.30 (L) 04/15/2023   LDLCALC 137 (H) 04/15/2023   TRIG 182.0 (H) 04/15/2023   CHOLHDL 6 04/15/2023   Lab Results  Component Value Date   ALT 23 04/15/2023   AST 25 04/15/2023   ALKPHOS 73 04/15/2023   BILITOT 0.5 04/15/2023     History Patient Active Problem List   Diagnosis Date Noted   Requires typhoid vaccination 08/16/2017   Need for malaria prophylaxis 08/16/2017   Asthma 05/31/2015   Eczema 05/31/2015   Past Medical History:  Diagnosis Date   Asthma    Past Surgical History:  Procedure Laterality Date   DIRECT LARYNGOSCOPY N/A 05/02/2020   Procedure: DIRECT LARYNGOSCOPY WITH FOREIGN BODY REMOVAL;  Surgeon: Serena Colonel, MD;  Location: [facility] OR;  Service: ENT;  Laterality: N/A;   Allergies  Allergen Reactions   Augmentin [Amoxicillin-Pot Clavulanate] Nausea And Vomiting   Prior to Admission medications   Medication Sig Start Date End Date Taking? Authorizing Provider  albuterol (VENTOLIN HFA) 108 (90 Base) MCG/ACT inhaler INHALE 1-2 PUFFS INTO THE LUNGS EVERY 4 HOURS AS NEEDED FOR WHEEZING/SHORTNESS OF BREATH 04/15/23   Shade Flood, MD  Budesonide 90 MCG/ACT inhaler Inhale 2 puffs into the lungs 2 (two) times daily. 04/15/23   Shade Flood, MD  Naproxen Sod-diphenhydrAMINE (ALEVE PM) 220-25 MG TABS Take 1 tablet by mouth daily as needed  (pain/sleep).    [provider]   Social History   Socioeconomic History   Marital status: Married    Spouse name: Not on file   Number of children: Not on file   Years of education: Not on file   Highest education level: Not on file  Occupational History   Not on file  Tobacco Use   Smoking status: Never   Smokeless tobacco: Never  Substance and Sexual Activity   Alcohol use: No   Drug use: No   Sexual activity: Yes  Other Topics Concern   Not on file  Social History Narrative   Not on file   Social Drivers of Health   Financial Resource Strain: Not on file  Food Insecurity: Not on file  Transportation Needs: Not on file  Physical Activity: Not on file  Stress: Not on file  Social Connections: Unknown (07/18/2021)   Received from [facility]   Social Network    Social Network: Not on file  Intimate Partner Violence: Unknown (06/08/2021)   Received from [facility]   HITS    Physically Hurt: Not on file    Insult or Talk Down To: Not on file    Threaten Physical Harm: Not on file    Scream or Curse: Not on file    Review of Systems   Objective:  There were no vitals filed for this visit.  Physical Exam     Assessment & Plan:  Chritopher Coster is a 48 y.o. male . No diagnosis found.   No orders of the defined types were placed in this encounter.  There are no Patient Instructions on file for this visit.    Signed,   Meredith Staggers, MD [facility], [facility] 06/10/23 8:39 AM

## 2023-07-05 ENCOUNTER — Other Ambulatory Visit: Payer: Self-pay | Admitting: Family Medicine

## 2023-07-05 DIAGNOSIS — J45909 Unspecified asthma, uncomplicated: Secondary | ICD-10-CM

## 2023-09-22 ENCOUNTER — Other Ambulatory Visit: Payer: Self-pay | Admitting: Family Medicine

## 2023-09-22 DIAGNOSIS — J45909 Unspecified asthma, uncomplicated: Secondary | ICD-10-CM

## 2023-10-14 ENCOUNTER — Encounter: Payer: Self-pay | Admitting: Family Medicine

## 2023-10-14 ENCOUNTER — Ambulatory Visit (INDEPENDENT_AMBULATORY_CARE_PROVIDER_SITE_OTHER): Payer: BC Managed Care – PPO | Admitting: Family Medicine

## 2023-10-14 VITALS — BP 140/84 | HR 90 | Temp 97.7°F | Ht 61.0 in | Wt 168.6 lb

## 2023-10-14 DIAGNOSIS — J45909 Unspecified asthma, uncomplicated: Secondary | ICD-10-CM

## 2023-10-14 DIAGNOSIS — Z1211 Encounter for screening for malignant neoplasm of colon: Secondary | ICD-10-CM | POA: Diagnosis not present

## 2023-10-14 MED ORDER — BUDESONIDE-FORMOTEROL FUMARATE 80-4.5 MCG/ACT IN AERO: 2.0000 | INHALATION_SPRAY | Freq: Two times a day (BID) | RESPIRATORY_TRACT | 3 refills | Status: DC

## 2023-10-14 NOTE — Patient Instructions (Addendum)
 Start symbicort  2 puffs twice per day for asthma. This should be much less costly than Pulmicort .  Symbicort  should be used every day as a maintenance medicine. Albuterol  inhaler only as needed for breakthrough wheezing. Recheck in 1 month. Return to the clinic or go to the nearest emergency room if any of your symptoms worsen or new symptoms occur.  We will order cologuard colon cancer screening test.   Take care

## 2023-10-14 NOTE — Progress Notes (Signed)
 Subjective:  Patient ID: Matthew Brooks, male    DOB: 1975-04-28  Age: 48 y.o. MRN: [id number]  CC:  Chief Complaint  Patient presents with   Follow-up    Patient states inhaler it doesn't work good. Wants to try different options.     HPI Matthew Brooks presents for   Asthma Mild intermittent. Previously had used Pulmicort for maintenance, albuterol as needed, but had been off inhalers for 3 months, then back on meds at his February 10 visit.  Denied recent need for albuterol at that time.  Continued on budesonide inhaler with option to taper off during the summer as a less dusty environment at work may have been helping control at that time.  Albuterol if needed.  Using albuterol 2-3 times per day.  Not using budesonide - too costly? I called pharmacy - reprocessed  - higher d/t deductible? Over $500 for 3 mo supply. Symbicort much less - approx $18.     HM: Screening options with colonoscopy versus Cologuard discussed. Discussed timing of repeat testing intervals if normal, as well as potential need for diagnostic Colonoscopy if positive Cologuard. Understanding expressed, and chose Cologuard. No FH of colon CA, no personal hx of polyps or bleeding.    Positive PHQ: Discussed results. Some trouble sleeping at times, denies depression or anhedonia, no SI.    History Patient Active Problem List   Diagnosis Date Noted   Requires typhoid vaccination 08/16/2017   Need for malaria prophylaxis 08/16/2017   Asthma 05/31/2015   Eczema 05/31/2015   Past Medical History:  Diagnosis Date   Asthma    Past Surgical History:  Procedure Laterality Date   DIRECT LARYNGOSCOPY N/A 05/02/2020   Procedure: DIRECT LARYNGOSCOPY WITH FOREIGN BODY REMOVAL;  Surgeon: Matthew Oliphant, MD;  Location: [facility] OR;  Service: ENT;  Laterality: N/A;   Allergies  Allergen Reactions   Augmentin [Amoxicillin-Pot Clavulanate] Nausea And Vomiting   Prior to Admission medications   Medication Sig Start Date End Date  Taking? Authorizing Provider  albuterol (VENTOLIN HFA) 108 (90 Base) MCG/ACT inhaler INHALE 1-2 PUFFS INTO THE LUNGS EVERY 4 HOURS AS NEEDED FOR WHEEZING/SHORTNESS OF BREATH 09/23/23  Yes Levora Reyes SAUNDERS, MD  Budesonide 90 MCG/ACT inhaler Inhale 2 puffs into the lungs 2 (two) times daily. 04/15/23  Yes Levora Reyes SAUNDERS, MD  Naproxen Sod-diphenhydrAMINE (ALEVE PM) 220-25 MG TABS Take 1 tablet by mouth daily as needed (pain/sleep).   Yes [provider]   Social History   Socioeconomic History   Marital status: Married    Spouse name: Not on file   Number of children: Not on file   Years of education: Not on file   Highest education level: Not on file  Occupational History   Not on file  Tobacco Use   Smoking status: Never   Smokeless tobacco: Never  Substance and Sexual Activity   Alcohol use: No   Drug use: No   Sexual activity: Yes  Other Topics Concern   Not on file  Social History Narrative   Not on file   Social Drivers of Health   Financial Resource Strain: Low Risk  (10/14/2023)   Overall Financial Resource Strain (CARDIA)    Difficulty of Paying Living Expenses: Not hard at all  Food Insecurity: No Food Insecurity (10/14/2023)   Hunger Vital Sign    Worried About Running Out of Food in the Last Year: Never true    Ran Out of Food in the Last Year: Never true  Transportation Needs: No Transportation Needs (10/14/2023)   PRAPARE - Administrator, Civil Service (Medical): No    Lack of Transportation (Non-Medical): No  Physical Activity: Not on file  Stress: Not on file  Social Connections: Unknown (07/18/2021)   Received from [facility]   Social Network    Social Network: Not on file  Intimate Partner Violence: Not At Risk (10/14/2023)   Humiliation, Afraid, Rape, and Kick questionnaire    Fear of Current or Ex-Partner: No    Emotionally Abused: No    Physically Abused: No    Sexually Abused: No    Review of Systems  Per HPI.   Objective:   Vitals:   10/14/23 0853  BP: (!) 152/100  Pulse: 90  Temp: 97.7 F (36.5 C)  TempSrc: Oral  SpO2: 94%  Weight: 168 lb 9.6 oz (76.5 kg)  Height: 5' 1 (1.549 m)    Physical Exam Vitals reviewed.  Constitutional:      Appearance: He is well-developed.  HENT:     Head: Normocephalic and atraumatic.     Right Ear: Tympanic membrane, ear canal and external ear normal.     Left Ear: Tympanic membrane, ear canal and external ear normal.     Nose: No rhinorrhea.     Mouth/Throat:     Pharynx: No oropharyngeal exudate or posterior oropharyngeal erythema.  Eyes:     Conjunctiva/sclera: Conjunctivae normal.     Pupils: Pupils are equal, round, and reactive to light.  Cardiovascular:     Rate and Rhythm: Normal rate and regular rhythm.     Heart sounds: Normal heart sounds. No murmur heard. Pulmonary:     Effort: Pulmonary effort is normal.     Breath sounds: Normal breath sounds. No wheezing, rhonchi or rales.  Abdominal:     Palpations: Abdomen is soft.     Tenderness: There is no abdominal tenderness.  Musculoskeletal:     Cervical back: Neck supple.  Lymphadenopathy:     Cervical: No cervical adenopathy.  Skin:    General: Skin is warm and dry.     Findings: No rash.  Neurological:     Mental Status: He is alert and oriented to person, place, and time.  Psychiatric:        Behavior: Behavior normal.        Assessment & Plan:  Matthew Brooks is a 48 y.o. male . Persistent asthma without complication, unspecified asthma severity - Plan: budesonide -formoterol  (SYMBICORT ) 80-4.5 MCG/ACT inhaler  - Decreased control on albuterol  alone.  Budesonide  was cost prohibitive.  Called pharmacist as above, Symbicort  should be covered much better.  Start 80/4.52 puffs twice daily, recheck in 1 month.  Albuterol  only if needed for breakthrough symptoms.  Elevated blood pressure in office could be in part due to the frequent use of albuterol .  Will recheck at follow-up as  well.  Repeat in office 140/84.  Screening for colon cancer - Plan: Cologuard   Meds ordered this encounter  Medications   budesonide -formoterol  (SYMBICORT ) 80-4.5 MCG/ACT inhaler    Sig: Inhale 2 puffs into the lungs 2 (two) times daily.    Dispense:  1 each    Refill:  3   Patient Instructions  Start symbicort  2 puffs twice per day for asthma. This should be much less costly. Albuterol  inhaler only as needed for breatkthrough wheezing. Recheck in 1 month. Return to the clinic or go to the nearest emergency room if any of your symptoms worsen or  new symptoms occur.  We will order cologuard colon cancer screening test.       Signed,   Reyes Pines, MD [facility], [facility] 10/14/23 9:35 AM

## 2023-11-15 ENCOUNTER — Encounter: Payer: Self-pay | Admitting: Family Medicine

## 2023-11-15 ENCOUNTER — Ambulatory Visit (INDEPENDENT_AMBULATORY_CARE_PROVIDER_SITE_OTHER): Admitting: Family Medicine

## 2023-11-15 VITALS — BP 122/64 | HR 74 | Temp 98.0°F | Ht 61.0 in | Wt 169.4 lb

## 2023-11-15 DIAGNOSIS — J45909 Unspecified asthma, uncomplicated: Secondary | ICD-10-CM | POA: Diagnosis not present

## 2023-11-15 DIAGNOSIS — R7309 Other abnormal glucose: Secondary | ICD-10-CM | POA: Diagnosis not present

## 2023-11-15 DIAGNOSIS — E785 Hyperlipidemia, unspecified: Secondary | ICD-10-CM

## 2023-11-15 LAB — COMPREHENSIVE METABOLIC PANEL WITH GFR
ALT: 36 U/L (ref 0–53)
AST: 26 U/L (ref 0–37)
Albumin: 4.4 g/dL (ref 3.5–5.2)
Alkaline Phosphatase: 74 U/L (ref 39–117)
BUN: 22 mg/dL (ref 6–23)
CO2: 30 meq/L (ref 19–32)
Calcium: 9.6 mg/dL (ref 8.4–10.5)
Chloride: 99 meq/L (ref 96–112)
Creatinine, Ser: 0.96 mg/dL (ref 0.40–1.50)
GFR: 94 mL/min (ref >60.00)
Glucose, Bld: 153 mg/dL — ABNORMAL HIGH (ref 70–99)
Potassium: 3.4 meq/L — ABNORMAL LOW (ref 3.5–5.1)
Sodium: 138 meq/L (ref 135–145)
Total Bilirubin: 0.8 mg/dL (ref 0.2–1.2)
Total Protein: 7.9 g/dL (ref 6.0–8.3)

## 2023-11-15 LAB — LIPID PANEL
Cholesterol: 241 mg/dL — ABNORMAL HIGH (ref 0–200)
HDL: 43.7 mg/dL (ref >39.00)
LDL Cholesterol: 135 mg/dL — ABNORMAL HIGH (ref 0–99)
NonHDL: 197.13
Total CHOL/HDL Ratio: 6
Triglycerides: 312 mg/dL — ABNORMAL HIGH (ref 0.0–149.0)
VLDL: 62.4 mg/dL — ABNORMAL HIGH (ref 0.0–40.0)

## 2023-11-15 LAB — HEMOGLOBIN A1C: Hgb A1c MFr Bld: 8.8 % — ABNORMAL HIGH (ref 4.6–6.5)

## 2023-11-15 NOTE — Patient Instructions (Addendum)
 Your lungs sound good today.  I am glad to hear that the Symbicort  has been easier to afford and it sounds like that medicine is working well.  Continue 2 puffs/day of the Symbicort .  Albuterol  only if needed.  If you require albuterol  more than 2 days/week, let me know and we can adjust your medications further.  Otherwise lets recheck in 3 months.  I am also rechecking your blood sugar test today as that was elevated in February.  Depending on those readings we may need to discuss medication.  Take care!

## 2023-11-15 NOTE — Progress Notes (Signed)
 Subjective:  Patient ID: Matthew Brooks, male    DOB: 10-Nov-1975  Age: 48 y.o. MRN: [id number]  CC:  Chief Complaint  Patient presents with   Asthma    1 month recheck, notes he is doing better    HPI [name] presents for   Persistent asthma Follow-up from August 11.  Decreased control and albuterol  him, budesonide  was cost prohibitive, Symbicort  appeared to be covered better after discussion with pharmacy, started on 80/4.5 2 puffs daily here for recheck.  Feeling better on current prescription.  Using sympicort 2 puffs/day, albuterol  only - if needed,less use - less than once per day. Elevated BP last visit.   Elevated BP Noted last visit.  Improved today.  No antihypertensives. BP Readings from Last 3 Encounters:  11/15/23 122/64  10/14/23 (!) 140/84  04/15/23 118/70   Elevated hemoglobin A1c Noted in February.  No medications.  Plan for recheck today.  Denies frequency with urination, blurry vision nausea vomiting or other new symptoms.   Hyperlipidemia: No current meds/statin. 'The 10-year ASCVD risk score (Arnett DK, et al., 2019) is: 3.5%   Values used to calculate the score:     Age: 57 years     Clincally relevant sex: Male     Is Non-Hispanic African American: No     Diabetic: No     Tobacco smoker: No     Systolic Blood Pressure: 122 mmHg     Is BP treated: No     HDL Cholesterol: 38.3 mg/dL     Total Cholesterol: 211 mg/dL  Lab Results  Component Value Date   CHOL 211 (H) 04/15/2023   HDL 38.30 (L) 04/15/2023   LDLCALC 137 (H) 04/15/2023   TRIG 182.0 (H) 04/15/2023   CHOLHDL 6 04/15/2023   Lab Results  Component Value Date   ALT 23 04/15/2023   AST 25 04/15/2023   ALKPHOS 73 04/15/2023   BILITOT 0.5 04/15/2023   Prior abnormal PHQ.  He has been some difficulty in understanding.  He denies depression symptoms, denies anhedonia, denies suicidal ideation.    11/15/2023    9:00 AM 10/14/2023    9:11 AM 04/15/2023    8:46 AM 10/13/2020    9:51 AM  08/10/2020    3:39 PM  Depression screen PHQ 2/9  Decreased Interest 0 3 0 2 0  Down, Depressed, Hopeless 0 2 0 2 0  PHQ - 2 Score 0 5 0 4 0  Altered sleeping  3 0 1 0  Tired, decreased energy  3 0 1 0  Change in appetite  1 0 0 0  Feeling bad or failure about yourself   2 0 0 0  Trouble concentrating  1 0 1 0  Moving slowly or fidgety/restless  2 0 0 0  Suicidal thoughts  2 0 0 0  PHQ-9 Score  19 0 7 0  Difficult doing work/chores  Somewhat difficult        History Patient Active Problem List   Diagnosis Date Noted   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/02/2020   Requires typhoid vaccination 08/16/2017   Need for malaria prophylaxis 08/16/2017   Asthma 05/31/2015   Eczema 05/31/2015   Past Medical History:  Diagnosis Date   Asthma    Past Surgical History:  Procedure Laterality Date   DIRECT LARYNGOSCOPY N/A 05/02/2020   Procedure: DIRECT LARYNGOSCOPY WITH FOREIGN BODY REMOVAL;  Surgeon: Jesus Oliphant, MD;  Location: [facility] OR;  Service: ENT;  Laterality: N/A;   Allergies  Allergen Reactions   Augmentin  [Amoxicillin -Pot Clavulanate] Nausea And Vomiting   Prior to Admission medications   Medication Sig Start Date End Date Taking? Authorizing Provider  albuterol  (VENTOLIN  HFA) 108 (90 Base) MCG/ACT inhaler INHALE 1-2 PUFFS INTO THE LUNGS EVERY 4 HOURS AS NEEDED FOR WHEEZING/SHORTNESS OF BREATH 09/23/23  Yes Levora Reyes SAUNDERS, MD  budesonide -formoterol  (SYMBICORT ) 80-4.5 MCG/ACT inhaler Inhale 2 puffs into the lungs 2 (two) times daily. 10/14/23  Yes Levora Reyes SAUNDERS, MD  Naproxen Sod-diphenhydrAMINE  (ALEVE PM) 220-25 MG TABS Take 1 tablet by mouth daily as needed (pain/sleep).   Yes [provider]   Social History   Socioeconomic History   Marital status: Married    Spouse name: Not on file   Number of children: Not on file   Years of education: Not on file   Highest education level: Not on file  Occupational History   Not on file  Tobacco Use   Smoking status: Never    Smokeless tobacco: Never  Substance and Sexual Activity   Alcohol use: No   Drug use: No   Sexual activity: Yes  Other Topics Concern   Not on file  Social History Narrative   Not on file   Social Drivers of Health   Financial Resource Strain: Low Risk  (10/14/2023)   Overall Financial Resource Strain (CARDIA)    Difficulty of Paying Living Expenses: Not hard at all  Food Insecurity: No Food Insecurity (10/14/2023)   Hunger Vital Sign    Worried About Running Out of Food in the Last Year: Never true    Ran Out of Food in the Last Year: Never true  Transportation Needs: No Transportation Needs (10/14/2023)   PRAPARE - Administrator, Civil Service (Medical): No    Lack of Transportation (Non-Medical): No  Physical Activity: Not on file  Stress: Not on file  Social Connections: Unknown (07/18/2021)   Received from [facility]   Social Network    Social Network: Not on file  Intimate Partner Violence: Not At Risk (10/14/2023)   Humiliation, Afraid, Rape, and Kick questionnaire    Fear of Current or Ex-Partner: No    Emotionally Abused: No    Physically Abused: No    Sexually Abused: No    Review of Systems  Per HPI Objective:   Vitals:   11/15/23 0852  BP: 122/64  Pulse: 74  Temp: 98 F (36.7 C)  TempSrc: Temporal  SpO2: 98%  Weight: 169 lb 6.4 oz (76.8 kg)  Height: 5' 1 (1.549 m)     Physical Exam Vitals reviewed.  Constitutional:      Appearance: He is well-developed.  HENT:     Head: Normocephalic and atraumatic.  Neck:     Vascular: No carotid bruit or JVD.  Cardiovascular:     Rate and Rhythm: Normal rate and regular rhythm.     Heart sounds: Normal heart sounds. No murmur heard. Pulmonary:     Effort: Pulmonary effort is normal. No respiratory distress.     Breath sounds: Normal breath sounds. No stridor. No wheezing, rhonchi or rales.  Musculoskeletal:     Right lower leg: No edema.     Left lower leg: No edema.  Skin:    General:  Skin is warm and dry.  Neurological:     Mental Status: He is alert and oriented to person, place, and time.  Psychiatric:        Mood and Affect: Mood normal.  Assessment & Plan:  [name] is a 48 y.o. male . Persistent asthma without complication, unspecified asthma severity Improved control with Symbicort , continue same.  Albuterol  as needed for breakthrough symptoms with RTC precautions if increased need  Elevated hemoglobin A1c - Plan: Hemoglobin A1c Prior A1c at diabetic level, no recent testing, denies symptoms of hyperglycemia.  Check labs and consider medication if persistent elevation.  Hyperlipidemia, unspecified hyperlipidemia type - Plan: Lipid panel, Comprehensive metabolic panel with GFR Low ASCVD risk for previously, check labs, and consider medication if diabetic or depending on readings.  No orders of the defined types were placed in this encounter.  Patient Instructions  Your lungs sound good today.  I am glad to hear that the Symbicort  has been easier to afford and it sounds like that medicine is working well.  Continue 2 puffs/day of the Symbicort .  Albuterol  only if needed.  If you require albuterol  more than 2 days/week, let me know and we can adjust your medications further.  Otherwise lets recheck in 3 months.  I am also rechecking your blood sugar test today as that was elevated in February.  Depending on those readings we may need to discuss medication.  Take care!    Signed,   Reyes Pines, MD [facility], [facility] 11/15/23 9:01 AM

## 2023-11-21 ENCOUNTER — Ambulatory Visit: Payer: Self-pay | Admitting: Family Medicine

## 2023-11-22 NOTE — Telephone Encounter (Signed)
 Called to relay lab results and notes per PCP but no answer and unable to leave message.

## 2023-11-22 NOTE — Telephone Encounter (Signed)
-----   Message from Reyes JONELLE Pines sent at 11/21/2023  8:40 PM EDT ----- Call patient.  67-month blood sugar test unfortunately has increased.  Would like to see A1c below 7.  Triglycerides were elevated, cholesterol levels were elevated.  Potassium was borderline low.   Blood sugar elevated on electrolytes but other electrolytes looked okay.  I would recommend starting metformin once per day for blood sugar initially, and a low-dose cholesterol medication once per day as with diabetes cholesterol should be lower.  Could then recheck labs  at a follow-up visit with me in 6 weeks, so we can discuss his medications further at that time.   Let me know if this is okay and I will send in the meds.  Happy to meet with him sooner if needed as  well. ----- Message ----- From: Interface, Lab In Three Zero One Sent: 11/15/2023  12:48 PM EDT To: Reyes JONELLE Pines, MD

## 2023-11-25 NOTE — Progress Notes (Signed)
 Tried to call patient to relay lab results. Unable to leave a vm to return call

## 2023-11-26 NOTE — Progress Notes (Signed)
 Third attempt in trying to reach patient to go over lab results. Patient did not answer. I will mail out a lab letter

## 2023-12-15 ENCOUNTER — Other Ambulatory Visit: Payer: Self-pay | Admitting: Family Medicine

## 2023-12-15 DIAGNOSIS — J45909 Unspecified asthma, uncomplicated: Secondary | ICD-10-CM

## 2024-01-10 ENCOUNTER — Other Ambulatory Visit: Payer: Self-pay | Admitting: Family Medicine

## 2024-01-10 DIAGNOSIS — J45909 Unspecified asthma, uncomplicated: Secondary | ICD-10-CM

## 2024-02-14 ENCOUNTER — Encounter: Payer: Self-pay | Admitting: Family Medicine

## 2024-02-14 ENCOUNTER — Ambulatory Visit: Admitting: Family Medicine

## 2024-02-14 VITALS — BP 136/92 | HR 89 | Temp 98.3°F | Resp 20 | Ht 61.0 in | Wt 172.4 lb

## 2024-02-14 DIAGNOSIS — E785 Hyperlipidemia, unspecified: Secondary | ICD-10-CM | POA: Diagnosis not present

## 2024-02-14 DIAGNOSIS — R030 Elevated blood-pressure reading, without diagnosis of hypertension: Secondary | ICD-10-CM | POA: Diagnosis not present

## 2024-02-14 DIAGNOSIS — E1165 Type 2 diabetes mellitus with hyperglycemia: Secondary | ICD-10-CM

## 2024-02-14 DIAGNOSIS — E876 Hypokalemia: Secondary | ICD-10-CM | POA: Diagnosis not present

## 2024-02-14 DIAGNOSIS — J45909 Unspecified asthma, uncomplicated: Secondary | ICD-10-CM

## 2024-02-14 LAB — COMPREHENSIVE METABOLIC PANEL WITH GFR
ALT: 29 U/L (ref 0–53)
AST: 21 U/L (ref 0–37)
Albumin: 4.4 g/dL (ref 3.5–5.2)
Alkaline Phosphatase: 75 U/L (ref 39–117)
BUN: 23 mg/dL (ref 6–23)
CO2: 28 meq/L (ref 19–32)
Calcium: 9.1 mg/dL (ref 8.4–10.5)
Chloride: 101 meq/L (ref 96–112)
Creatinine, Ser: 0.95 mg/dL (ref 0.40–1.50)
GFR: 95.03 mL/min (ref >60.00)
Glucose, Bld: 145 mg/dL — ABNORMAL HIGH (ref 70–99)
Potassium: 3.9 meq/L (ref 3.5–5.1)
Sodium: 136 meq/L (ref 135–145)
Total Bilirubin: 0.3 mg/dL (ref 0.2–1.2)
Total Protein: 7.7 g/dL (ref 6.0–8.3)

## 2024-02-14 LAB — LIPID PANEL
Cholesterol: 223 mg/dL — ABNORMAL HIGH (ref 0–200)
HDL: 37.8 mg/dL — ABNORMAL LOW (ref >39.00)
NonHDL: 185.56
Total CHOL/HDL Ratio: 6
Triglycerides: 417 mg/dL — ABNORMAL HIGH (ref 0.0–149.0)
VLDL: 83.4 mg/dL — ABNORMAL HIGH (ref 0.0–40.0)

## 2024-02-14 LAB — HEMOGLOBIN A1C: Hgb A1c MFr Bld: 8.3 % — ABNORMAL HIGH (ref 4.6–6.5)

## 2024-02-14 LAB — LDL CHOLESTEROL, DIRECT: Direct LDL: 150 mg/dL

## 2024-02-14 MED ORDER — METFORMIN HCL 500 MG PO TABS: 500.0000 mg | ORAL_TABLET | Freq: Two times a day (BID) | ORAL | 3 refills | Status: AC

## 2024-02-14 NOTE — Patient Instructions (Addendum)
 Lungs sound clear today.  Asthma does sometimes get worse with cold weather.  The least expensive option may be to stay on the Symbicort  but increase that to 3 puffs twice per day.  If that does not help you can go up to 4 puffs twice per day.  Continue albuterol  if needed for breakthrough wheezing but if you continue to require that more than 2 days a week, let me know and we can change medications again.   Follow-up in 6 weeks.  Blood pressure was slightly elevated today, could be related to your asthma medicine.  I am does not start a blood pressure medicine today but we will recheck that level in 6 weeks to decide.  Potassium was low on September labs, I am rechecking that today.  Blood sugar was too high last visit with worsening diabetes.  Start metformin.  I am checking labs again today.  Likely will need to start cholesterol medication as well but we will start with just metformin for now.  Recheck in 6 weeks.

## 2024-02-14 NOTE — Progress Notes (Signed)
 Subjective:  Patient ID: Matthew Brooks, male    DOB: [birth year]  Age: 48 y.o. MRN: [id number]  CC:  Chief Complaint  Patient presents with   Asthma    3 month follow up. No questions or concerns.     HPI Matthew Brooks presents for   Persistent asthma Follow-up from September.  He was improved at that time with restart of controller medication with Symbicort  2 puffs/day, and was cost effective.  Albuterol  only if needed, and less than once per day at that time. Still using symbicort  2 puffs twice per day. Cold weather last week - albuterol  once per day. Still using albuterol  daily.   Elevated BP Blood pressure had been elevated on previous visit but improved at recheck in September, no antihypertensives.  BP Readings from Last 3 Encounters:  02/14/24 (!) 136/92  11/15/23 122/64  10/14/23 (!) 140/84   Hypokalemia Noted on labs in September, plan for repeat today.  He does use albuterol  as above.  Diabetes: With hyperglycemia, no current meds.  September labs indicated higher A1c and recommended starting metformin but lost to follow-up.  Have not seen him since September. Not checking home blood sugar readings. Declines flu vaccine.   Lab Results  Component Value Date   HGBA1C 8.8 (H) 11/15/2023   HGBA1C 8.1 (H) 04/15/2023   Lab Results  Component Value Date   LDLCALC 135 (H) 11/15/2023   CREATININE 0.96 11/15/2023     Hyperlipidemia: See labs in September, recommended statin, but had not followed up since that visit.  The 10-year ASCVD risk score (Arnett DK, et al., 2019) is: 4.4%   Values used to calculate the score:     Age: 14 years     Clinically relevant sex: Male     Is Non-Hispanic African American: No     Diabetic: No     Tobacco smoker: No     Systolic Blood Pressure: 136 mmHg     Is BP treated: No     HDL Cholesterol: 43.7 mg/dL     Total Cholesterol: 241 mg/dL  Lab Results  Component Value Date   CHOL 241 (H) 11/15/2023   HDL 43.70 11/15/2023    LDLCALC 135 (H) 11/15/2023   TRIG 312.0 (H) 11/15/2023   CHOLHDL 6 11/15/2023   Lab Results  Component Value Date   ALT 36 11/15/2023   AST 26 11/15/2023   ALKPHOS 74 11/15/2023   BILITOT 0.8 11/15/2023      History Patient Active Problem List   Diagnosis Date Noted   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 Authorizing Provider  albuterol  (VENTOLIN  HFA) 108 (90 Base) MCG/ACT inhaler INHALE 1-2 PUFFS INTO THE LUNGS EVERY 4 HOURS AS NEEDED FOR WHEEZING/SHORTNESS OF BREATH 12/16/23  Yes Levora Reyes SAUNDERS, MD  budesonide -formoterol  (SYMBICORT ) 80-4.5 MCG/ACT inhaler INHALE 2 PUFFS INTO THE LUNGS TWICE A DAY 01/10/24  Yes Levora Reyes SAUNDERS, MD  Naproxen Sod-diphenhydrAMINE  (ALEVE PM) 220-25 MG TABS Take 1 tablet by mouth daily as needed (pain/sleep).  Patient not taking: Reported on 02/14/2024    [provider]   Social History   Socioeconomic History   Marital status: Married    Spouse name: Not on file   Number of children: Not on file   Years of education: Not on file   Highest education level: Not on file  Occupational History   Not on file  Tobacco Use   Smoking status: Never   Smokeless tobacco: Never  Substance and Sexual Activity   Alcohol use: No   Drug use: No   Sexual activity: Yes  Other Topics Concern   Not on file  Social History Narrative   Not on file   Social Drivers of Health   Tobacco Use: Low Risk (02/14/2024)   Patient History    Smoking Tobacco Use: Never     Smokeless Tobacco Use: Never    Passive Exposure: Not on file  Financial Resource Strain: Low Risk (10/14/2023)   Overall Financial Resource Strain (CARDIA)    Difficulty of Paying Living Expenses: Not hard at all  Food Insecurity: No Food Insecurity (10/14/2023)   Epic    Worried About Programme Researcher, Broadcasting/film/video in the Last Year: Never true    Ran Out of Food in the Last Year: Never true  Transportation Needs: No Transportation Needs (10/14/2023)   Epic    Lack of Transportation (Medical): No    Lack of Transportation (Non-Medical): No  Physical Activity: Not on file  Stress: Not on file  Social Connections: Unknown (07/18/2021)   [facility]   Social Network    Social Network: Not on file  Intimate Partner Violence: Not At Risk (10/14/2023)   Epic    Fear of Current or Ex-Partner: No    Emotionally Abused: No    Physically Abused: No    Sexually Abused: No  Depression (PHQ2-9): Low Risk (02/14/2024)   Depression (PHQ2-9)    PHQ-2 Score: 4  Recent Concern: Depression (PHQ2-9) - High Risk (02/14/2024)   Depression (PHQ2-9)    PHQ-2 Score: 15  Alcohol Screen: Not on file  Housing: Low Risk (10/14/2023)   Epic    Unable to Pay for Housing in the Last Year: No    Number of Times Moved in the Last Year: 0    Homeless in the Last Year: No  Utilities: Not At Risk (10/14/2023)   Epic    Threatened with loss of utilities: No  Health Literacy: Not on file    Review of Systems  Constitutional:  Negative for fatigue and unexpected weight change.  Eyes:  Negative for visual disturbance.  Respiratory:  Positive for shortness of breath (with asthma flare.). Negative for cough and chest tightness.   Cardiovascular:  Negative for chest pain, palpitations and leg swelling.  Gastrointestinal:  Negative for abdominal pain and blood in stool.  Neurological:  Negative for dizziness, light-headedness and headaches.    Objective:   Vitals:   02/14/24 0845 02/14/24 0921  BP: (!)  150/96 (!) 136/92  Pulse: 89   Resp: 20   Temp: 98.3 F (36.8 C)   TempSrc: Temporal   SpO2: 98%   Weight: 172 lb 6.4 oz (78.2 kg)   Height: 5' 1 (1.549 m)     Physical Exam Vitals reviewed.  Constitutional:      Appearance: He is well-developed.  HENT:     Head: Normocephalic and atraumatic.  Neck:     Vascular: No carotid bruit or JVD.  Cardiovascular:  Rate and Rhythm: Normal rate and regular rhythm.     Heart sounds: Normal heart sounds. No murmur heard. Pulmonary:     Effort: Pulmonary effort is normal.     Breath sounds: Normal breath sounds. No rales.  Musculoskeletal:     Right lower leg: No edema.     Left lower leg: No edema.  Skin:    General: Skin is warm and dry.  Neurological:     Mental Status: He is alert and oriented to person, place, and time.  Psychiatric:        Mood and Affect: Mood normal.        Assessment & Plan:  Matthew Brooks is a 49 y.o. male . Persistent asthma without complication, unspecified asthma severity  - Improved but still decreased control with use of albuterol  daily.  Due to some difficulty with cost with other medications we will stay on Symbicort  for now.  Increase Symbicort  to 3 puffs twice daily for now, with option of 4 puffs twice daily if persistent symptoms.  Continue albuterol  for breakthrough symptoms.  6 weeks for recheck.  Elevated blood pressure reading  - Improved on recheck, hold on new meds for now.  May be related to use of albuterol .  Recheck in 6 weeks.  Hypokalemia - Plan: Comprehensive metabolic panel with GFR, CANCELED: Basic metabolic panel with GFR  - Repeat labs, adjust regimen accordingly, replete accordingly.  Type 2 diabetes mellitus with hyperglycemia, without long-term current use of insulin (HCC) - Plan: metFORMIN (GLUCOPHAGE) 500 MG tablet, Comprehensive metabolic panel with GFR, Hemoglobin A1c  - Uncontrolled with elevated A1c at 8.8 previously.  Start metformin.  Check updated labs today with  recheck in 6 weeks.  Hyperlipidemia, unspecified hyperlipidemia type - Plan: Comprehensive metabolic panel with GFR, Lipid panel  - Repeat labs, likely will recommend statin as diabetic but no other new meds for now.  6-week follow-up.  Meds ordered this encounter  Medications   metFORMIN (GLUCOPHAGE) 500 MG tablet    Sig: Take 1 tablet (500 mg total) by mouth 2 (two) times daily with a meal.    Dispense:  180 tablet    Refill:  3   Patient Instructions  Lungs sound clear today.  Asthma does sometimes get worse with cold weather.  The least expensive option may be to stay on the Symbicort  but increase that to 3 puffs twice per day.  If that does not help you can go up to 4 puffs twice per day.  Continue albuterol  if needed for breakthrough wheezing but if you continue to require that more than 2 days a week, let me know and we can change medications again.   Follow-up in 6 weeks.  Blood pressure was slightly elevated today, could be related to your asthma medicine.  I am does not start a blood pressure medicine today but we will recheck that level in 6 weeks to decide.  Potassium was low on September labs, I am rechecking that today.  Blood sugar was too high last visit with worsening diabetes.  Start metformin.  I am checking labs again today.  Likely will need to start cholesterol medication as well but we will start with just metformin for now.  Recheck in 6 weeks.    Signed,   Reyes Pines, MD [facility], [facility] 02/14/2024 9:29 AM      [1]  Allergies Allergen Reactions   Augmentin  [Amoxicillin -Pot Clavulanate] Nausea And Vomiting

## 2024-02-21 ENCOUNTER — Ambulatory Visit: Payer: Self-pay | Admitting: Family Medicine

## 2024-02-28 NOTE — Progress Notes (Signed)
 Called patient and was not able to leave a VM to return call

## 2024-03-02 NOTE — Progress Notes (Signed)
 Mailed lab letter

## 2024-03-02 NOTE — Progress Notes (Signed)
 Called patient and was not able to leave a VM to return call

## 2024-04-06 ENCOUNTER — Ambulatory Visit: Admitting: Family Medicine
